# Patient Record
Sex: Female | Born: 1990 | Race: White | Hispanic: No | Marital: Married | Smoking: Never smoker
Health system: Southern US, Community
[De-identification: ages and names within clinical notes are randomized; demographics above are authoritative.]

## PROBLEM LIST (undated history)

## (undated) ENCOUNTER — Inpatient Hospital Stay (HOSPITAL_COMMUNITY): Payer: Self-pay

## (undated) DIAGNOSIS — E282 Polycystic ovarian syndrome: Secondary | ICD-10-CM

## (undated) DIAGNOSIS — G40909 Epilepsy, unspecified, not intractable, without status epilepticus: Secondary | ICD-10-CM

## (undated) DIAGNOSIS — D649 Anemia, unspecified: Secondary | ICD-10-CM

## (undated) DIAGNOSIS — I10 Essential (primary) hypertension: Secondary | ICD-10-CM

## (undated) HISTORY — DX: Anemia, unspecified: D64.9

## (undated) HISTORY — DX: Polycystic ovarian syndrome: E28.2

---
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
## 2011-02-03 LAB — URINE MICROSCOPIC-ADD ON

## 2011-02-03 LAB — URINALYSIS, ROUTINE W REFLEX MICROSCOPIC
Bilirubin Urine: NEGATIVE
Glucose, UA: NEGATIVE
Ketones, ur: NEGATIVE
Nitrite: POSITIVE — AB
Specific Gravity, Urine: 1.02
Urobilinogen, UA: 0.2
pH: 6

## 2011-08-22 ENCOUNTER — Encounter (HOSPITAL_COMMUNITY): Payer: Self-pay | Admitting: *Deleted

## 2011-08-22 DIAGNOSIS — S0990XA Unspecified injury of head, initial encounter: Secondary | ICD-10-CM | POA: Insufficient documentation

## 2011-08-22 DIAGNOSIS — I10 Essential (primary) hypertension: Secondary | ICD-10-CM | POA: Insufficient documentation

## 2011-08-22 DIAGNOSIS — IMO0002 Reserved for concepts with insufficient information to code with codable children: Secondary | ICD-10-CM | POA: Insufficient documentation

## 2011-08-22 NOTE — ED Notes (Addendum)
Head struck by metal part of pressure washer hose.  Headache, nausea. No LOC

## 2011-08-23 ENCOUNTER — Emergency Department (HOSPITAL_COMMUNITY)
Admission: EM | Admit: 2011-08-23 | Discharge: 2011-08-23 | Disposition: A | Discharge status: Home / Self Care | Payer: BC Managed Care – PPO | Attending: Emergency Medicine | Admitting: Emergency Medicine

## 2011-08-23 DIAGNOSIS — S0990XA Unspecified injury of head, initial encounter: Secondary | ICD-10-CM

## 2011-08-23 HISTORY — DX: Essential (primary) hypertension: I10

## 2011-08-23 NOTE — Discharge Instructions (Signed)
Head Injury, Adult  You have had a head injury that does not appear serious at this time. A concussion is a state of changed mental ability, usually from a blow to the head. You should take clear liquids for the rest of the day and then resume your regular diet. You should not take sedatives or alcoholic beverages for as long as directed by your caregiver after discharge. After injuries such as yours, most problems occur within the first 24 hours.  SYMPTOMS  These minor symptoms may be experienced after discharge:  · Memory difficulties.  · Dizziness.  · Headaches.  · Double vision.  · Hearing difficulties.  · Depression.  · Tiredness.  · Weakness.  · Difficulty with concentration.  If you experience any of these problems, you should not be alarmed. A concussion requires a few days for recovery. Many patients with head injuries frequently experience such symptoms. Usually, these problems disappear without medical care. If symptoms last for more than one day, notify your caregiver. See your caregiver sooner if symptoms are becoming worse rather than better.  HOME CARE INSTRUCTIONS   · During the next 24 hours you must stay with someone who can watch you for the warning signs listed below.  Although it is unlikely that serious side effects will occur, you should be aware of signs and symptoms which may necessitate your return to this location. Side effects may occur up to 7 - 10 days following the injury. It is important for you to carefully monitor your condition and contact your caregiver or seek immediate medical attention if there is a change in your condition.  SEEK IMMEDIATE MEDICAL CARE IF:   · There is confusion or drowsiness.  · You can not awaken the injured person.  · There is nausea (feeling sick to your stomach) or continued, forceful vomiting.  · You notice dizziness or unsteadiness which is getting worse, or inability to walk.  · You have convulsions or unconsciousness.  · You experience severe,  persistent headaches not relieved by over-the-counter or prescription medicines for pain. (Do not take aspirin as this impairs clotting abilities). Take other pain medications only as directed.  · You can not use arms or legs normally.  · There is clear or bloody discharge from the nose or ears.  MAKE SURE YOU:   · Understand these instructions.  · Will watch your condition.  · Will get help right away if you are not doing well or get worse.  Document Released: 04/25/2005 Document Revised: 04/14/2011 Document Reviewed: 03/13/2009  Exit[employer]® Patient Information ©2012 [employer].

## 2011-08-23 NOTE — ED Provider Notes (Signed)
History     CSN: [id number]  Arrival date & time 08/22/11  2209   First MD Initiated Contact with Patient 08/23/11 0016      Chief Complaint  Patient presents with  . Head Injury    (Consider location/radiation/quality/duration/timing/severity/associated sxs/prior treatment) Patient is a 21 y.o. female presenting with head injury. The history is provided by the patient.  Head Injury    patient reports she was struck in the head this evening by the tip of a pressure washer.  She was not struck by the spray itself.  She had no loss of consciousness.  This happened approximately 9 hours ago.  She presents the ER now because of nausea and worsening headache.  She's had no vomiting.  She denies weakness of her upper or lower extremities.  She has no neck pain.  She has no changes in vision.  She took ibuprofen earlier and it has not helped her pain.  She drove herself to the emergency Department be evaluated.  She is not on anticoagulants.  Nothing worsens her symptoms.  Nothing improves her symptoms.  Her symptoms are mild to moderate in severity and constant in nature  Past Medical History  Diagnosis Date  . Hypertension     History reviewed. No pertinent past surgical history.  History reviewed. No pertinent family history.  History  Substance Use Topics  . Smoking status: Never Smoker   . Smokeless tobacco: Not on file  . Alcohol Use: No    OB History    Grav Para Term Preterm Abortions TAB SAB Ect Mult Living                  Review of Systems  All other systems reviewed and are negative.    Allergies  Review of patient's allergies indicates no known allergies.  Home Medications  No current outpatient prescriptions on file.  BP 135/90  Pulse 108  Temp(Src) 100 F (37.8 C) (Oral)  Resp 24  Ht 5\' 6"  (1.676 m)  Wt 210 lb (95.255 kg)  BMI 33.89 kg/m2  SpO2 100%  LMP 08/01/2011  Physical Exam  Nursing note and vitals reviewed. Constitutional: She is  oriented to person, place, and time. She appears well-developed and well-nourished. No distress.  HENT:  Head: Normocephalic and atraumatic.  Eyes: EOM are normal.  Neck: Normal range of motion.  Cardiovascular: Normal rate, regular rhythm and normal heart sounds.   Pulmonary/Chest: Effort normal and breath sounds normal.  Abdominal: Soft. She exhibits no distension. There is no tenderness.  Musculoskeletal: Normal range of motion.  Neurological: She is alert and oriented to person, place, and time.  Skin: Skin is warm and dry.  Psychiatric: She has a normal mood and affect. Judgment normal.    ED Course  Procedures (including critical care time)  Labs Reviewed - No data to display No results found.   1. Minor head injury       MDM  Minor closed head injury. Normal neuro exam. No indication for imaging at this time. Doubt intracranial bleed. Doubt skull fracture. Pt given information on head trauma including strict instructions to return for nausea/vomiting, confusion, altered level of consciousness or new weakness. Pt understand and are agreeable to the plan. Pt drove herself to the ER         Lyanne Co, MD 08/23/11 [phone]

## 2012-02-01 ENCOUNTER — Emergency Department (HOSPITAL_COMMUNITY)
Admission: EM | Admit: 2012-02-01 | Discharge: 2012-02-01 | Disposition: A | Discharge status: Home / Self Care | Payer: BC Managed Care – PPO | Attending: Emergency Medicine | Admitting: Emergency Medicine

## 2012-02-01 ENCOUNTER — Emergency Department (HOSPITAL_COMMUNITY): Payer: BC Managed Care – PPO

## 2012-02-01 ENCOUNTER — Encounter (HOSPITAL_COMMUNITY): Payer: Self-pay | Admitting: *Deleted

## 2012-02-01 DIAGNOSIS — J40 Bronchitis, not specified as acute or chronic: Secondary | ICD-10-CM | POA: Insufficient documentation

## 2012-02-01 DIAGNOSIS — J029 Acute pharyngitis, unspecified: Secondary | ICD-10-CM | POA: Insufficient documentation

## 2012-02-01 MED ORDER — GUAIFENESIN-CODEINE 100-10 MG/5ML PO SYRP: ORAL_SOLUTION | ORAL | Status: DC

## 2012-02-01 NOTE — ED Provider Notes (Signed)
History     CSN: [id number]  Arrival date & time 02/01/12  1056   First MD Initiated Contact with Patient 02/01/12 1137      Chief Complaint  Patient presents with  . Cough  . Nasal Congestion  . Sore Throat    (Consider location/radiation/quality/duration/timing/severity/associated sxs/prior treatment) HPI Comments: Taking dayquil and nightquil with no improvement.  Patient is a 21 y.o. female presenting with cough and pharyngitis. The history is provided by the patient. No language interpreter was used.  Cough This is a new problem. The current episode started 2 days ago. The problem occurs every few minutes. The problem has not changed since onset.The cough is productive of sputum. The maximum temperature recorded prior to her arrival was 102 to 102.9 F. Associated symptoms include sore throat and myalgias. Pertinent negatives include no ear pain, no shortness of breath and no wheezing. The treatment provided mild relief. She is not a smoker. Her past medical history does not include bronchitis, pneumonia, bronchiectasis, COPD, emphysema or asthma.  Sore Throat Associated symptoms include congestion, coughing, a fever, myalgias and a sore throat.    Past Medical History  Diagnosis Date  . Hypertension     History reviewed. No pertinent past surgical history.  No family history on file.  History  Substance Use Topics  . Smoking status: Never Smoker   . Smokeless tobacco: Not on file  . Alcohol Use: No    OB History    Grav Para Term Preterm Abortions TAB SAB Ect Mult Living                  Review of Systems  Constitutional: Positive for fever.  HENT: Positive for congestion and sore throat. Negative for ear pain.   Respiratory: Positive for cough. Negative for shortness of breath and wheezing.   Musculoskeletal: Positive for myalgias.  All other systems reviewed and are negative.    Allergies  Review of patient's allergies indicates no known  allergies.  Home Medications   Current Outpatient Rx  Name Route Sig Dispense Refill  . IBUPROFEN 200 MG PO TABS Oral Take 600 mg by mouth every 6 (six) hours as needed. Pain    . NYQUIL PO Oral Take 2 tablets by mouth every 4 (four) hours as needed. Cold Symptoms    . DAYQUIL PO Oral Take 2 tablets by mouth every 4 (four) hours as needed. Cold Symptoms    . GUAIFENESIN-CODEINE 100-10 MG/5ML PO SYRP  10 ml q 4-6 hrs prn cough 240 mL 0    BP 132/62  Pulse 79  Temp 97.9 F (36.6 C) (Oral)  Resp 18  Ht 5\' 7"  (1.702 m)  Wt 200 lb (90.719 kg)  BMI 31.32 kg/m2  SpO2 99%  LMP 01/25/2012  Physical Exam  Nursing note and vitals reviewed. Constitutional: She is oriented to person, place, and time. She appears well-developed and well-nourished. No distress.  HENT:  Head: Normocephalic and atraumatic.  Mouth/Throat: Uvula is midline, oropharynx is clear and moist and mucous membranes are normal. No uvula swelling. No oropharyngeal exudate, posterior oropharyngeal edema, posterior oropharyngeal erythema or tonsillar abscesses.  Eyes: EOM are normal.  Neck: Normal range of motion.  Cardiovascular: Normal rate, regular rhythm and normal heart sounds.   Pulmonary/Chest: Effort normal and breath sounds normal. No accessory muscle usage. Not tachypneic. No respiratory distress. She has no decreased breath sounds. She has no wheezes. She has no rhonchi. She has no rales. She exhibits no tenderness.  Abdominal:  Soft. She exhibits no distension. There is no tenderness.  Musculoskeletal: Normal range of motion.  Neurological: She is alert and oriented to person, place, and time.  Skin: Skin is warm and dry.  Psychiatric: She has a normal mood and affect. Judgment normal.    ED Course  Procedures (including critical care time)  Labs Reviewed - No data to display Dg Chest 2 View  02/01/2012  *RADIOLOGY REPORT*  Clinical Data: Cough, fever  CHEST - 2 VIEW  Comparison: Report 01/21/2003, no images  available  Findings: Cardiomediastinal silhouette is unremarkable.  No acute infiltrate or pleural effusion.  No pulmonary edema.  Bony thorax is unremarkable.  IMPRESSION: No active disease.   Original Report Authenticated By: Natasha Mead, M.D.      1. Bronchitis   2. Pharyngitis       MDM  rx-robitussin AC, 240 ml       Cough   Evalina Field, [place] 02/01/12 1656

## 2012-02-01 NOTE — ED Notes (Signed)
C/o cough, nasal congestion, sore throat and fever x 2 days; reports cough productive of yellow/white sputum.

## 2012-02-01 NOTE — ED Provider Notes (Signed)
Medical screening examination/treatment/procedure(s) were performed by non-physician practitioner and as supervising physician I was immediately available for consultation/collaboration.   Carleene Cooper III, MD 02/01/12 2018

## 2012-02-01 NOTE — ED Notes (Signed)
Instructions and prescriptions reviewed; f/u information provided. Verbalizes understanding. A&ox4, in no distress.

## 2012-02-01 NOTE — ED Notes (Signed)
Pt presents with cold like symptoms, productive cough, sore throat, headache and pressure, pain in head increases when bending over. Pt states has been sick x 2 days. BBS clear and equal.  Pt states has white/yellow production from both nasal congestion and productive cough. Throat is noted red. NAD noted at this time. Pt states had a 101 fever this morning, denies taking motrin/tylenol.

## 2012-10-14 ENCOUNTER — Emergency Department (HOSPITAL_COMMUNITY)
Admission: EM | Admit: 2012-10-14 | Discharge: 2012-10-14 | Disposition: A | Discharge status: Home / Self Care | Payer: BC Managed Care – PPO | Attending: Emergency Medicine | Admitting: Emergency Medicine

## 2012-10-14 ENCOUNTER — Encounter (HOSPITAL_COMMUNITY): Payer: Self-pay | Admitting: *Deleted

## 2012-10-14 ENCOUNTER — Other Ambulatory Visit: Payer: Self-pay

## 2012-10-14 ENCOUNTER — Emergency Department (HOSPITAL_COMMUNITY): Payer: BC Managed Care – PPO

## 2012-10-14 DIAGNOSIS — R42 Dizziness and giddiness: Secondary | ICD-10-CM | POA: Insufficient documentation

## 2012-10-14 DIAGNOSIS — I10 Essential (primary) hypertension: Secondary | ICD-10-CM | POA: Insufficient documentation

## 2012-10-14 DIAGNOSIS — J029 Acute pharyngitis, unspecified: Secondary | ICD-10-CM | POA: Insufficient documentation

## 2012-10-14 DIAGNOSIS — R0789 Other chest pain: Secondary | ICD-10-CM | POA: Insufficient documentation

## 2012-10-14 LAB — CBC WITH DIFFERENTIAL/PLATELET
Basophils Absolute: 0 10*3/uL (ref 0.0–0.1)
Basophils Relative: 0 % (ref 0–1)
Eosinophils Absolute: 0 10*3/uL (ref 0.0–0.7)
Eosinophils Relative: 0 % (ref 0–5)
HCT: 40.1 % (ref 36.0–46.0)
Hemoglobin: 13.1 g/dL (ref 12.0–15.0)
Lymphocytes Relative: 15 % (ref 12–46)
Lymphs Abs: 1.7 10*3/uL (ref 0.7–4.0)
MCH: 28.9 pg (ref 26.0–34.0)
MCHC: 32.7 g/dL (ref 30.0–36.0)
MCV: 88.5 fL (ref 78.0–100.0)
Monocytes Absolute: 0.9 10*3/uL (ref 0.1–1.0)
Monocytes Relative: 8 % (ref 3–12)
Neutro Abs: 8.9 10*3/uL — ABNORMAL HIGH (ref 1.7–7.7)
Neutrophils Relative %: 77 % (ref 43–77)
Platelets: 275 10*3/uL (ref 150–400)
RBC: 4.53 MIL/uL (ref 3.87–5.11)
RDW: 12.6 % (ref 11.5–15.5)
WBC: 11.6 10*3/uL — ABNORMAL HIGH (ref 4.0–10.5)

## 2012-10-14 LAB — COMPREHENSIVE METABOLIC PANEL
ALT: 14 U/L (ref 0–35)
AST: 14 U/L (ref 0–37)
Albumin: 3.9 g/dL (ref 3.5–5.2)
Alkaline Phosphatase: 63 U/L (ref 39–117)
BUN: 11 mg/dL (ref 6–23)
CO2: 27 mEq/L (ref 19–32)
Calcium: 9.8 mg/dL (ref 8.4–10.5)
Chloride: 99 mEq/L (ref 96–112)
Creatinine, Ser: 0.77 mg/dL (ref 0.50–1.10)
GFR calc Af Amer: >90 mL/min (ref >90)
GFR calc non Af Amer: >90 mL/min (ref >90)
Glucose, Bld: 88 mg/dL (ref 70–99)
Potassium: 3.6 mEq/L (ref 3.5–5.1)
Sodium: 136 mEq/L (ref 135–145)
Total Bilirubin: 0.3 mg/dL (ref 0.3–1.2)
Total Protein: 7.7 g/dL (ref 6.0–8.3)

## 2012-10-14 NOTE — ED Notes (Addendum)
Pt c/o sob that started suddenly a hour ago associated with being "lightheaded", chest heaviness that has been intermittent that started today,chest heaviness is not radiating.  reports nausea two days ago, sore throat yesterday but not today,

## 2012-10-14 NOTE — ED Provider Notes (Signed)
History  This chart was scribed for Benny Lennert, MD by Bennett Scrape, ED Scribe. This patient was seen in room APA02/APA02 and the patient's care was started at 3:49 PM.  CSN: [id number]  Arrival date & time 10/14/12  1400   First MD Initiated Contact with Patient 10/14/12 1549      Chief Complaint  Patient presents with  . Shortness of Breath  . Dizziness     Patient is a 22 y.o. female presenting with shortness of breath. The history is provided by the patient. No language interpreter was used.  Shortness of Breath Severity:  Moderate Onset quality:  Gradual Duration:  4 hours Timing:  Intermittent Progression:  Partially resolved Chronicity:  New Context: not emotional upset and not URI   Relieved by:  Nothing Worsened by:  Nothing tried Ineffective treatments:  None tried Associated symptoms: sore throat   Associated symptoms: no abdominal pain, no chest pain, no cough, no headaches and no rash   Risk factors: no hx of PE/DVT, no recent surgery and no tobacco use     HPI Comments: Mercedes Koch is a 22 y.o. female who presents to the Emergency Department complaining of 2 episodes of gradual onset, constant SOB with associated chest tightness and lightheadedness that last for 30 minutes and started while at work. She states that the symptoms would worsen but then gradually resolve. Last episode occurred 2 hours ago. She states that currently she still has mild chest tightness but denies SOB. She denies having any extra stress at work today stating that she was working [employer]er, [employer] which is a normal station for her. She reports a sore throat but denies urinary symptoms, fever and cough as associated symptoms. She has a h/o HTN but denies smoking and alcohol use.  Past Medical History  Diagnosis Date  . Hypertension     History reviewed. No pertinent past surgical history.  No family history on file.  History  Substance Use Topics  . Smoking status: Never Smoker    . Smokeless tobacco: Not on file  . Alcohol Use: No  works at [employer]  No OB history provided.  Review of Systems  Constitutional: Negative for appetite change and fatigue.  HENT: Positive for sore throat. Negative for congestion, sinus pressure and ear discharge.   Eyes: Negative for discharge.  Respiratory: Positive for chest tightness and shortness of breath. Negative for cough.   Cardiovascular: Negative for chest pain.  Gastrointestinal: Negative for abdominal pain and diarrhea.  Genitourinary: Negative for frequency and hematuria.  Musculoskeletal: Negative for back pain.  Skin: Negative for rash.  Neurological: Positive for light-headedness. Negative for seizures and headaches.  Psychiatric/Behavioral: Negative for hallucinations.    Allergies  Review of patient's allergies indicates no known allergies.  Home Medications   Current Outpatient Rx  Name  Route  Sig  Dispense  Refill  . Pseudoephedrine-APAP-DM (DAYQUIL PO)   Oral   Take 2 tablets by mouth every 4 (four) hours as needed. Cold Symptoms           Triage Vitals: BP 147/89  Pulse 105  Temp(Src) 98 F (36.7 C) (Oral)  Resp 18  Ht 5\' 7"  (1.702 m)  Wt 225 lb (102.059 kg)  BMI 35.23 kg/m2  SpO2 98%  LMP 09/30/2012  Physical Exam  Nursing note and vitals reviewed. Constitutional: She is oriented to person, place, and time. She appears well-developed.  HENT:  Head: Normocephalic.  Eyes: Conjunctivae and EOM are normal. No scleral  icterus.  Neck: Neck supple. No thyromegaly present.  Cardiovascular: Normal rate and regular rhythm.  Exam reveals no gallop and no friction rub.   No murmur heard. Pulmonary/Chest: No stridor. She has no wheezes. She has no rales. She exhibits no tenderness.  Abdominal: She exhibits no distension. There is no tenderness. There is no rebound.  Musculoskeletal: Normal range of motion. She exhibits no edema.  Lymphadenopathy:    She has no cervical adenopathy.   Neurological: She is oriented to person, place, and time. Coordination normal.  Skin: No rash noted. No erythema.  Psychiatric: She has a normal mood and affect. Her behavior is normal.    ED Course  Procedures (including critical care time)  DIAGNOSTIC STUDIES: Oxygen Saturation is 98% on room air, normal by my interpretation.    COORDINATION OF CARE: 4:00 PM-Discussed treatment plan which includes CXR, CBC panel and CMP with pt at bedside and pt agreed to plan.   5:47 PM-Informed pt of radiology and lab work results. Discussed discharge plan with pt and pt agreed to plan. Also advised pt to follow up as needed and pt agreed. Addressed symptoms to return for with pt.   Labs Reviewed  CBC WITH DIFFERENTIAL - Abnormal; Notable for the following:    WBC 11.6 (*)    Neutro Abs 8.9 (*)    All other components within normal limits  COMPREHENSIVE METABOLIC PANEL    Dg Chest 2 View  10/14/2012   *RADIOLOGY REPORT*  Clinical Data: Short of breath, dizzy, weak on chest  CHEST - 2 VIEW  Comparison: Prior chest x-ray 02/01/2012  Findings: The lungs are well-aerated and free from pulmonary edema, focal airspace consolidation or pulmonary nodule.  Cardiac and mediastinal contours are within normal limits.  No pneumothorax, or pleural effusion. No acute osseous findings.  IMPRESSION:  No acute cardiopulmonary disease.   Original Report Authenticated By: Malachy Moan, M.D.     No diagnosis found.   Date: 10/14/2012  Rate: 88  Rhythm: normal sinus rhythm  QRS Axis: normal  Intervals: normal  ST/T Wave abnormalities: normal  Conduction Disutrbances:none  Narrative Interpretation:   Old EKG Reviewed: none available    MDM  Chest wall pain The chart was scribed for me under my direct supervision.  I personally performed the history, physical, and medical decision making and all procedures in the evaluation of this patient.Benny Lennert, MD 10/14/12 [phone]

## 2013-02-28 ENCOUNTER — Emergency Department (HOSPITAL_COMMUNITY): Payer: BC Managed Care – PPO

## 2013-02-28 ENCOUNTER — Encounter (HOSPITAL_COMMUNITY): Payer: Self-pay | Admitting: Emergency Medicine

## 2013-02-28 ENCOUNTER — Emergency Department (HOSPITAL_COMMUNITY)
Admission: EM | Admit: 2013-02-28 | Discharge: 2013-02-28 | Disposition: A | Discharge status: Home / Self Care | Payer: BC Managed Care – PPO | Attending: Emergency Medicine | Admitting: Emergency Medicine

## 2013-02-28 DIAGNOSIS — I10 Essential (primary) hypertension: Secondary | ICD-10-CM | POA: Insufficient documentation

## 2013-02-28 DIAGNOSIS — R109 Unspecified abdominal pain: Secondary | ICD-10-CM

## 2013-02-28 DIAGNOSIS — R110 Nausea: Secondary | ICD-10-CM | POA: Insufficient documentation

## 2013-02-28 DIAGNOSIS — R1013 Epigastric pain: Secondary | ICD-10-CM | POA: Insufficient documentation

## 2013-02-28 DIAGNOSIS — Z3202 Encounter for pregnancy test, result negative: Secondary | ICD-10-CM | POA: Insufficient documentation

## 2013-02-28 DIAGNOSIS — R1012 Left upper quadrant pain: Secondary | ICD-10-CM | POA: Insufficient documentation

## 2013-02-28 DIAGNOSIS — R1011 Right upper quadrant pain: Secondary | ICD-10-CM | POA: Insufficient documentation

## 2013-02-28 LAB — URINALYSIS, ROUTINE W REFLEX MICROSCOPIC
Bilirubin Urine: NEGATIVE
Glucose, UA: NEGATIVE mg/dL
Ketones, ur: NEGATIVE mg/dL
Nitrite: NEGATIVE
Protein, ur: NEGATIVE mg/dL
Specific Gravity, Urine: >1.03 — ABNORMAL HIGH (ref 1.005–1.030)
Urobilinogen, UA: 0.2 mg/dL (ref 0.0–1.0)
pH: 6 (ref 5.0–8.0)

## 2013-02-28 LAB — CBC
HCT: 42.2 % (ref 36.0–46.0)
Hemoglobin: 13.9 g/dL (ref 12.0–15.0)
MCH: 29.3 pg (ref 26.0–34.0)
MCHC: 32.9 g/dL (ref 30.0–36.0)
MCV: 89 fL (ref 78.0–100.0)
Platelets: 294 10*3/uL (ref 150–400)
RBC: 4.74 MIL/uL (ref 3.87–5.11)
RDW: 13 % (ref 11.5–15.5)
WBC: 9.5 10*3/uL (ref 4.0–10.5)

## 2013-02-28 LAB — URINE MICROSCOPIC-ADD ON

## 2013-02-28 LAB — BASIC METABOLIC PANEL
BUN: 14 mg/dL (ref 6–23)
CO2: 28 mEq/L (ref 19–32)
Calcium: 10.1 mg/dL (ref 8.4–10.5)
Chloride: 101 mEq/L (ref 96–112)
Creatinine, Ser: 0.84 mg/dL (ref 0.50–1.10)
GFR calc Af Amer: >90 mL/min (ref >90)
GFR calc non Af Amer: >90 mL/min (ref >90)
Glucose, Bld: 105 mg/dL — ABNORMAL HIGH (ref 70–99)
Potassium: 4 mEq/L (ref 3.5–5.1)
Sodium: 139 mEq/L (ref 135–145)

## 2013-02-28 LAB — PREGNANCY, URINE: Preg Test, Ur: NEGATIVE

## 2013-02-28 MED ORDER — PANTOPRAZOLE SODIUM 40 MG IV SOLR
40.0000 mg | Freq: Once | INTRAVENOUS | Status: AC
  Administered 2013-02-28: 40 mg via INTRAVENOUS
  Filled 2013-02-28: qty 40

## 2013-02-28 MED ORDER — ONDANSETRON HCL 4 MG/2ML IJ SOLN
4.0000 mg | Freq: Once | INTRAMUSCULAR | Status: AC
  Administered 2013-02-28: 4 mg via INTRAVENOUS
  Filled 2013-02-28: qty 2

## 2013-02-28 MED ORDER — CEPHALEXIN 500 MG PO CAPS: 500.0000 mg | ORAL_CAPSULE | Freq: Four times a day (QID) | ORAL | Status: DC

## 2013-02-28 MED ORDER — PANTOPRAZOLE SODIUM 20 MG PO TBEC: 20.0000 mg | DELAYED_RELEASE_TABLET | Freq: Every day | ORAL | Status: DC

## 2013-02-28 MED ORDER — HYDROMORPHONE HCL PF 1 MG/ML IJ SOLN
0.5000 mg | Freq: Once | INTRAMUSCULAR | Status: AC
  Administered 2013-02-28: 0.5 mg via INTRAVENOUS
  Filled 2013-02-28: qty 1

## 2013-02-28 MED ORDER — HYDROCODONE-ACETAMINOPHEN 5-325 MG PO TABS: 1.0000 | ORAL_TABLET | Freq: Four times a day (QID) | ORAL | Status: DC | PRN

## 2013-02-28 NOTE — ED Provider Notes (Signed)
CSN: [id number]     Arrival date & time 02/28/13  1629 History  This chart was scribed for Benny Lennert, MD by Bennett Scrape, ED Scribe. This patient was seen in room APA16A/APA16A and the patient's care was started at 6:33 PM.   Chief Complaint  Patient presents with  . Abdominal Pain    Patient is a 22 y.o. female presenting with abdominal pain. The history is provided by the patient. No language interpreter was used.  Abdominal Pain Pain location:  Epigastric, RUQ and LUQ Pain radiates to:  Back Pain severity:  Moderate Onset quality:  Gradual Timing:  Constant Progression:  Worsening Chronicity:  New Worsened by:  Deep breathing Ineffective treatments:  None tried Associated symptoms: nausea   Associated symptoms: no chest pain, no cough, no diarrhea, no fatigue and no hematuria     HPI Comments: Mercedes Koch is a 22 y.o. female who presents to the Emergency Department complaining of bilateral upper abdominal pain, mostly in the epigastric region, that gradually began radiating to her back. She states that currently the radiation pain is more severe and reports that the pain is worsened with deep breathing. She lists nausea as an associated symptom. She denies any prior episodes of the same. She denies any emesis or recent leg swelling.    Past Medical History  Diagnosis Date  . Hypertension    History reviewed. No pertinent past surgical history. No family history on file. History  Substance Use Topics  . Smoking status: Never Smoker   . Smokeless tobacco: Not on file  . Alcohol Use: No   No OB history provided.  Review of Systems  Constitutional: Negative for appetite change and fatigue.  HENT: Negative for congestion, ear discharge and sinus pressure.   Eyes: Negative for discharge.  Respiratory: Negative for cough.   Cardiovascular: Negative for chest pain and leg swelling.  Gastrointestinal: Positive for nausea and abdominal pain. Negative for  diarrhea.  Genitourinary: Negative for frequency and hematuria.  Musculoskeletal: Negative for back pain.  Skin: Negative for rash.  Neurological: Negative for seizures and headaches.  Psychiatric/Behavioral: Negative for hallucinations.    Allergies  Review of patient's allergies indicates no known allergies.  Home Medications  No current outpatient prescriptions on file.  Triage Vitals: BP 139/90  Pulse 94  Temp(Src) 98.1 F (36.7 C) (Oral)  Resp 18  Ht 5\' 5"  (1.651 m)  Wt 218 lb (98.884 kg)  BMI 36.28 kg/m2  SpO2 100%  Physical Exam  Nursing note and vitals reviewed. Constitutional: She is oriented to person, place, and time. She appears well-developed and well-nourished.  HENT:  Head: Normocephalic and atraumatic.  Eyes: Conjunctivae and EOM are normal. No scleral icterus.  Neck: Neck supple. No thyromegaly present.  Cardiovascular: Normal rate and regular rhythm.  Exam reveals no gallop and no friction rub.   No murmur heard. Pulmonary/Chest: Effort normal and breath sounds normal. No stridor. She has no wheezes. She has no rales. She exhibits no tenderness.  Abdominal: She exhibits no distension. There is tenderness (moderate RUQ tenderness). There is no rebound.  Musculoskeletal: Normal range of motion. She exhibits no edema.  Lymphadenopathy:    She has no cervical adenopathy.  Neurological: She is oriented to person, place, and time. She exhibits normal muscle tone. Coordination normal.  Skin: No rash noted. No erythema.  Psychiatric: She has a normal mood and affect. Her behavior is normal.    ED Course  Procedures (including critical care time)  DIAGNOSTIC STUDIES: Oxygen Saturation is 100% on room air, normal by my interpretation.    COORDINATION OF CARE: 6:36 PM-Discussed treatment plan which includes BMP, CBC and UA with pt at bedside and pt agreed to plan.   8:22 PM-Informed pt of radiology and lab work results that shows UTI. Discussed discharge  plan which includes medications with pt and pt agreed to plan. Also advised pt to follow up as needed and pt agreed. Addressed symptoms to return for with pt.   Labs Review Labs Reviewed  URINALYSIS, ROUTINE W REFLEX MICROSCOPIC - Abnormal; Notable for the following:    APPearance CLOUDY (*)    Specific Gravity, Urine >1.030 (*)    Hgb urine dipstick LARGE (*)    Leukocytes, UA TRACE (*)    All other components within normal limits  BASIC METABOLIC PANEL - Abnormal; Notable for the following:    Glucose, Bld 105 (*)    All other components within normal limits  URINE MICROSCOPIC-ADD ON - Abnormal; Notable for the following:    Squamous Epithelial / LPF MANY (*)    Bacteria, UA MANY (*)    All other components within normal limits  URINE CULTURE  PREGNANCY, URINE  CBC   Imaging Review Dg Abd Acute W/chest  02/28/2013   CLINICAL DATA:  Epigastric pain and nausea  EXAM: ACUTE ABDOMEN SERIES (ABDOMEN 2 VIEW & CHEST 1 VIEW)  COMPARISON:  Chest 10/14/2012  FINDINGS: Chest: The lungs are clear. Negative for infiltrate effusion or heart failure.  Abdomen: Undigested tablets are present in the colon. Negative for bowel obstruction or free air. Negative for ileus.  IMPRESSION: Negative abdominal radiographs.  No acute cardiopulmonary disease.   Electronically Signed   By: Marlan Palau M.D.   On: 02/28/2013 19:42    EKG Interpretation   None       MDM  No diagnosis found.   The chart was scribed for me under my direct supervision.  I personally performed the history, physical, and medical decision making and all procedures in the evaluation of this patient.Benny Lennert, MD 02/28/13 2025

## 2013-02-28 NOTE — ED Notes (Signed)
Epigastric pain radiating into back

## 2013-03-02 LAB — URINE CULTURE: Colony Count: >=100000

## 2013-03-03 ENCOUNTER — Telehealth (HOSPITAL_COMMUNITY): Payer: Self-pay | Admitting: Emergency Medicine

## 2013-03-03 NOTE — ED Notes (Signed)
Post ED Visit - Positive Culture Follow-up  Culture report reviewed by antimicrobial stewardship pharmacist: []  Wes Dulaney, Pharm.D., BCPS []  Celedonio Miyamoto, Pharm.D., BCPS []  Georgina Pillion, Pharm.D., BCPS []  [place], [place].D., BCPS, AAHIVP [x]  [name], Pharm.D., BCPS, AAHIVP  Positive urine culture Treated with Keflex, organism sensitive to the same and no further patient follow-up is required at this time.  Mercedes Koch 03/03/2013, 10:10 AM

## 2013-03-06 ENCOUNTER — Other Ambulatory Visit: Payer: Self-pay | Admitting: *Deleted

## 2013-03-06 ENCOUNTER — Other Ambulatory Visit: Payer: Self-pay | Admitting: Obstetrics and Gynecology

## 2013-03-07 MED ORDER — DROSPIRENONE-ETHINYL ESTRADIOL 3-0.02 MG PO TABS: ORAL_TABLET | ORAL | Status: DC

## 2016-06-18 ENCOUNTER — Encounter (HOSPITAL_COMMUNITY): Payer: Self-pay | Admitting: Emergency Medicine

## 2016-06-18 ENCOUNTER — Emergency Department (HOSPITAL_COMMUNITY)
Admission: EM | Admit: 2016-06-18 | Discharge: 2016-06-18 | Disposition: A | Discharge status: Home / Self Care | Payer: Medicaid Other | Attending: Emergency Medicine | Admitting: Emergency Medicine

## 2016-06-18 DIAGNOSIS — R600 Localized edema: Secondary | ICD-10-CM | POA: Insufficient documentation

## 2016-06-18 DIAGNOSIS — M7989 Other specified soft tissue disorders: Secondary | ICD-10-CM

## 2016-06-18 DIAGNOSIS — Z79899 Other long term (current) drug therapy: Secondary | ICD-10-CM | POA: Diagnosis not present

## 2016-06-18 DIAGNOSIS — I10 Essential (primary) hypertension: Secondary | ICD-10-CM | POA: Diagnosis not present

## 2016-06-18 HISTORY — DX: Epilepsy, unspecified, not intractable, without status epilepticus: G40.909

## 2016-06-18 LAB — URINALYSIS, ROUTINE W REFLEX MICROSCOPIC
Bilirubin Urine: NEGATIVE
Glucose, UA: NEGATIVE mg/dL
Hgb urine dipstick: NEGATIVE
Ketones, ur: 20 mg/dL — AB
Nitrite: NEGATIVE
Protein, ur: NEGATIVE mg/dL
Specific Gravity, Urine: 1.009 (ref 1.005–1.030)
pH: 7 (ref 5.0–8.0)

## 2016-06-18 MED ORDER — ENOXAPARIN SODIUM 120 MG/0.8ML ~~LOC~~ SOLN
1.0000 mg/kg | Freq: Once | SUBCUTANEOUS | Status: AC
  Administered 2016-06-18: 120 mg via SUBCUTANEOUS
  Filled 2016-06-18: qty 0.8

## 2016-06-18 MED ORDER — CEPHALEXIN 500 MG PO CAPS: 500.0000 mg | ORAL_CAPSULE | Freq: Four times a day (QID) | ORAL | 0 refills | Status: DC

## 2016-06-18 NOTE — Discharge Instructions (Signed)
Follow up tomorrow for us of left leg

## 2016-06-18 NOTE — ED Notes (Signed)
Call to lab re: urine culture 

## 2016-06-18 NOTE — ED Notes (Signed)
Noticed leg swelling 2 days ago- Dr Francee PiccoloMcCloud is physician G1, P0  FHR 154 by oppler with Barbara CowerJason, RN

## 2016-06-18 NOTE — ED Notes (Signed)
Pt reluctant to leave until her FHT are reassessed-

## 2016-06-18 NOTE — ED Triage Notes (Signed)
Patient c/o leg cramps with swelling in left leg x1 week that is progressively getting worse. Patient is [redacted] weeks pregnant, first pregnancy, [facility] [place].  Denies any problems with blood pressure. No complications with pregnancy at this time.

## 2016-06-18 NOTE — ED Notes (Signed)
Dr Zammit in to assess 

## 2016-06-18 NOTE — ED Provider Notes (Signed)
[facility] DEPT Provider Note   CSN: [id number] Arrival date & time: 06/18/16  1039     History   Chief Complaint Chief Complaint  Patient presents with  . Leg Swelling    HPI Mercedes Koch is a 26 y.o. female.  Pt complains of swelling to left leg for one week.  She is [redacted] weeks pregnant    Leg Pain   This is a new problem. The current episode started more than 1 week ago. The problem occurs constantly. The problem has not changed since onset.The pain is present in the left lower leg. The quality of the pain is described as dull. The pain is at a severity of 5/10. The pain is moderate. Pertinent negatives include no numbness. She has tried nothing for the symptoms. The treatment provided no relief. There has been no history of extremity trauma.    Past Medical History:  Diagnosis Date  . Epilepsy (HCC)   . Hypertension     There are no active problems to display for this patient.   History reviewed. No pertinent surgical history.  OB History    Gravida Para Term Preterm AB Living   1             SAB TAB Ectopic Multiple Live Births                   Home Medications    Prior to Admission medications   Medication Sig Start Date End Date Taking? Authorizing Provider  drospirenone-ethinyl estradiol (GIANVI) 3-0.02 MG tablet TAKE ONE TABLET BY MOUTH EVERY DAY. Patient not taking: Reported on 06/18/2016 03/07/13   Tilda Burrow, MD    Family History History reviewed. No pertinent family history.  Social History Social History  Substance Use Topics  . Smoking status: Never Smoker  . Smokeless tobacco: Never Used  . Alcohol use No     Allergies   Patient has no known allergies.   Review of Systems Review of Systems  Constitutional: Negative for appetite change and fatigue.  HENT: Negative for congestion, ear discharge and sinus pressure.   Eyes: Negative for discharge.  Respiratory: Negative for cough.   Cardiovascular: Negative for chest  pain.  Gastrointestinal: Negative for abdominal pain and diarrhea.  Genitourinary: Negative for frequency and hematuria.  Musculoskeletal: Negative for back pain.       Swelling pain left leg  Skin: Negative for rash.  Neurological: Negative for seizures, numbness and headaches.  Psychiatric/Behavioral: Negative for hallucinations.     Physical Exam Updated Vital Signs BP 129/91 (BP Location: Left Arm)   Pulse 93   Temp 98.1 F (36.7 C) (Oral)   Resp 16   Ht 5\' 5"  (1.651 m)   Wt 260 lb (117.9 kg)   LMP 12/22/2015   SpO2 98%   BMI 43.27 kg/m   Physical Exam  Constitutional: She is oriented to person, place, and time. She appears well-developed.  HENT:  Head: Normocephalic.  Eyes: Conjunctivae are normal.  Neck: No tracheal deviation present.  Cardiovascular:  No murmur heard. Musculoskeletal: Normal range of motion.  Swelling left leg with tenderness  Neurological: She is oriented to person, place, and time.  Skin: Skin is warm.  Psychiatric: She has a normal mood and affect.     ED Treatments / Results  Labs (all labs ordered are listed, but only abnormal results are displayed) Labs Reviewed - No data to display  EKG  EKG Interpretation None  Radiology No results found.  Procedures Procedures (including critical care time)  Medications Ordered in ED Medications - No data to display   Initial Impression / Assessment and Plan / ED Course  I have reviewed the triage vital signs and the nursing notes.  Pertinent labs & imaging results that were available during my care of the patient were reviewed by me and considered in my medical decision making (see chart for details).     Pt with possible dvt,  Pt to get lovenox and will get us of leg in am.  Ob-gyn consulted by phone  Final Clinical Impressions(s) / ED Diagnoses   Final diagnoses:  None    New Prescriptions New Prescriptions   No medications on file     Bethann BerkshireJoseph Tamisha Nordstrom,  MD 06/18/16 1507

## 2016-06-19 ENCOUNTER — Other Ambulatory Visit (HOSPITAL_COMMUNITY): Payer: Medicaid Other

## 2016-06-20 ENCOUNTER — Ambulatory Visit (HOSPITAL_COMMUNITY)
Admission: RE | Admit: 2016-06-20 | Discharge: 2016-06-20 | Disposition: A | Discharge status: Home / Self Care | Payer: Medicaid Other | Source: Ambulatory Visit | Attending: Emergency Medicine | Admitting: Emergency Medicine

## 2016-06-20 DIAGNOSIS — M7989 Other specified soft tissue disorders: Secondary | ICD-10-CM | POA: Insufficient documentation

## 2016-06-20 LAB — URINE CULTURE: Culture: <10000 — AB

## 2018-03-29 DIAGNOSIS — Z01419 Encounter for gynecological examination (general) (routine) without abnormal findings: Secondary | ICD-10-CM | POA: Diagnosis not present

## 2018-03-29 DIAGNOSIS — Z6841 Body Mass Index (BMI) 40.0 and over, adult: Secondary | ICD-10-CM | POA: Diagnosis not present

## 2018-03-29 DIAGNOSIS — N914 Secondary oligomenorrhea: Secondary | ICD-10-CM | POA: Diagnosis not present

## 2018-03-29 LAB — HM PAP SMEAR: HM Pap smear: NEGATIVE

## 2018-11-15 DIAGNOSIS — N912 Amenorrhea, unspecified: Secondary | ICD-10-CM | POA: Diagnosis not present

## 2018-12-18 DIAGNOSIS — N914 Secondary oligomenorrhea: Secondary | ICD-10-CM | POA: Diagnosis not present

## 2019-06-10 ENCOUNTER — Ambulatory Visit: Payer: BC Managed Care – PPO | Admitting: Family Medicine

## 2019-06-10 ENCOUNTER — Other Ambulatory Visit: Payer: Self-pay

## 2019-06-10 ENCOUNTER — Encounter: Payer: Self-pay | Admitting: Family Medicine

## 2019-06-10 VITALS — BP 126/78 | HR 88 | Temp 97.3°F | Resp 14 | Ht 65.0 in | Wt 298.2 lb

## 2019-06-10 DIAGNOSIS — E282 Polycystic ovarian syndrome: Secondary | ICD-10-CM

## 2019-06-10 DIAGNOSIS — R519 Headache, unspecified: Secondary | ICD-10-CM | POA: Diagnosis not present

## 2019-06-10 DIAGNOSIS — R5383 Other fatigue: Secondary | ICD-10-CM

## 2019-06-10 DIAGNOSIS — Z862 Personal history of diseases of the blood and blood-forming organs and certain disorders involving the immune mechanism: Secondary | ICD-10-CM

## 2019-06-10 DIAGNOSIS — E559 Vitamin D deficiency, unspecified: Secondary | ICD-10-CM

## 2019-06-10 DIAGNOSIS — Z7689 Persons encountering health services in other specified circumstances: Secondary | ICD-10-CM

## 2019-06-10 DIAGNOSIS — N914 Secondary oligomenorrhea: Secondary | ICD-10-CM

## 2019-06-10 DIAGNOSIS — H539 Unspecified visual disturbance: Secondary | ICD-10-CM

## 2019-06-10 DIAGNOSIS — Z6841 Body Mass Index (BMI) 40.0 and over, adult: Secondary | ICD-10-CM

## 2019-06-10 DIAGNOSIS — E039 Hypothyroidism, unspecified: Secondary | ICD-10-CM

## 2019-06-10 DIAGNOSIS — R635 Abnormal weight gain: Secondary | ICD-10-CM | POA: Diagnosis not present

## 2019-06-10 DIAGNOSIS — R7989 Other specified abnormal findings of blood chemistry: Secondary | ICD-10-CM

## 2019-06-10 DIAGNOSIS — E66813 Obesity, class 3: Secondary | ICD-10-CM

## 2019-06-10 DIAGNOSIS — I10 Essential (primary) hypertension: Secondary | ICD-10-CM | POA: Insufficient documentation

## 2019-06-10 DIAGNOSIS — E8881 Metabolic syndrome: Secondary | ICD-10-CM

## 2019-06-10 DIAGNOSIS — Z87898 Personal history of other specified conditions: Secondary | ICD-10-CM

## 2019-06-10 HISTORY — DX: Personal history of diseases of the blood and blood-forming organs and certain disorders involving the immune mechanism: Z86.2

## 2019-06-10 NOTE — Progress Notes (Signed)
Name: Mercedes Koch   MRN: [id number]    DOB: [birth year]   Date:06/10/2019       Progress Note  Chief Complaint  Patient presents with  . New Patient (Initial Visit)  . Migraine    with aura vision with spots, dizziness, mostly on 1 side of head behind eye, pt states cut out caffenine and has not had one this past month. Lost of energy and fatigue     Subjective:   Mercedes Koch is a 29 y.o. female, presents to clinic for routine follow up on the conditions listed above.  Pt new to est care [id number], hx of PCOS, last year was working towards trying to conceive again but has since stopped efforts with OB due to headaches.  She has not had a primary care provider for many years and had difficulty finding someone  Over the past year she had episodes of HA's, new, through most of 2020, started out maybe once a month and gradually worsened throughout the year. HA's became more frequent and severe, occurring almost every other day.  No hx of migraines before,  Prior to onset of HA's she has bilateral visual disturbance described as  "spots so intense she can't see" causing blurred vision/obscurred vision (no tunnel vision or curtain like vision loss or changes), then usually pain occurs as vision gets a little better, pain is usually located to left eye/temple behind eye, throbbing pain, associated with photophobia and nausea.  HA's usually last for 2-3 hours, tried excederine, tylenol, ibuprofen, laying down in a dark room, heat to head.  Usually lying down helps to decrease pain, usually there very dull for the remainer of the day and she will feel "out of it"  She did not have a PCP and could not get in to be evaluated when sx were severe Over the last month (Jan 2021) she cut out all caffeine and frequency and intensity decreased only 1 episode in the past month.  Caffeine was minimal to start with one cup of coffee a day and maybe a soda. No personal hx of HA, no family of migraines No  known family hx of aneurysm Last year she did take Clomid, progesterone and letrozole from her OB/GYN.  She did stop because of the headaches.  She has previously taken OCPs and other birth control and not had any oral hormonal medication because headaches.  She did some fertility treatment in the past when conceiving her daughter and did not have any side effects or headaches then either.  She has hx of seizure disorder - epilepsy, cannot remember last neuro visit.  She states that she believes she had seizure disorder with absence seizures and sometimes her whole body shaking when she had a fever.  She does not recall who she went to for neurology.  She does state that she had seizures through high school and even shortly after high school but does not recall ever going to neurology during those years.  Does not remember being on any medication for them either.  Hx of PCOS per Karna Christmas - on care everywhere only as of 2019 -no recent labs available in care everywhere  She had her daughter via C-section June 2018   Patient is concerned about weight gain and severe fatigue  She states she gained about 50 lbs during pregnancy 2017 -2018, lost all weight after delivery while nursing and was back to her baseline weight 160-180.  after stopping breast feeding gained back  a lot of weight even having weight higher than her top weight in pregnancy.  She states she is hungry all the time, she is sleeping mostly pretty good with her daughter, she does work 40 hours, has family members and her husband helping care for her daughter.  She tries to exercise but not as much as she would like to especially over the last year.  She endorses severe fatigued.  Also states her hair has thinned and skin has become dry over the past couple years. She endorses good moods does not feel depressed or down, she has no lack of interest in things do not feel that she is a failure or disappointment, did not have any postpartum  depression. She has never been to a dietitian or to any weight management specialist. No family history of thyroid disease or MDD  Mom with hx of MI very young she has had 2 heart attacks possibly when she was [age] or 39   Patient Active Problem List   Diagnosis Date Noted  . History of anemia 06/10/2019  . Hypertension 06/10/2019  . Metabolic syndrome 06/10/2019  . PCOS (polycystic ovarian syndrome) 06/10/2019  . Secondary oligomenorrhea 06/10/2019  . Class 3 severe obesity with body mass index (BMI) of 45.0 to 49.9 in adult [facility](HCC) 06/10/2019    Past Surgical History:  Procedure Laterality Date  . CESAREAN SECTION  2018    Family History  Problem Relation Age of Onset  . Hypertension Mother   . Hyperlipidemia Mother   . Osteoporosis Mother   . Fibromyalgia Mother   . Breast cancer Mother   . Heart attack Mother [id number]  . Hypertension Father   . Hyperlipidemia Father   . Breast cancer Maternal Grandmother   . Heart failure Maternal Grandmother   . Hyperlipidemia Maternal Grandmother   . Hypertension Maternal Grandmother   . Pneumonia Maternal Grandfather   . Dementia Maternal Grandfather   . Alzheimer's disease Maternal Grandfather   . Hypertension Maternal Grandfather   . Breast cancer Paternal Grandmother   . Stroke Paternal Grandmother   . Kidney failure Paternal Grandfather   . Prostate cancer Paternal Grandfather     Social History   Socioeconomic History  . Marital status: Married    Spouse name: jose  . Number of children: 1  . Years of education: 212  . Highest education level: Some college, no degree  Occupational History  . Occupation: [employer]  Tobacco Use  . Smoking status: Never Smoker  . Smokeless tobacco: Never Used  Substance and Sexual Activity  . Alcohol use: No  . Drug use: No  . Sexual activity: Yes  Other Topics Concern  . Not on file  Social History Narrative  . Not on file   Social Determinants of Health   Financial Resource  Strain:   . Difficulty of Paying Living Expenses: Not on file  Food Insecurity:   . Worried About Programme researcher, broadcasting/film/videounning Out of Food in the Last Year: Not on file  . Ran Out of Food in the Last Year: Not on file  Transportation Needs:   . Lack of Transportation (Medical): Not on file  . Lack of Transportation (Non-Medical): Not on file  Physical Activity:   . Days of Exercise per Week: Not on file  . Minutes of Exercise per Session: Not on file  Stress:   . Feeling of Stress : Not on file  Social Connections:   . Frequency of Communication with Friends and Family: Not  on file  . Frequency of Social Gatherings with Friends and Family: Not on file  . Attends Religious Services: Not on file  . Active Member of Clubs or Organizations: Not on file  . Attends Banker Meetings: Not on file  . Marital Status: Not on file  Intimate Partner Violence:   . Fear of Current or Ex-Partner: Not on file  . Emotionally Abused: Not on file  . Physically Abused: Not on file  . Sexually Abused: Not on file    No current outpatient medications on file.  No Known Allergies  Chart Review Today: I personally reviewed active problem list, medication list, allergies, family history, social history, health maintenance, notes from last encounter, lab results, imaging with the patient/caregiver today.  Review of Systems  Constitutional: Negative.   HENT: Negative.   Eyes: Negative.   Respiratory: Negative.   Cardiovascular: Negative.   Gastrointestinal: Negative.   Endocrine: Negative.   Genitourinary: Negative.   Musculoskeletal: Negative.   Skin: Negative.   Allergic/Immunologic: Negative.   Neurological: Negative.   Hematological: Negative.   Psychiatric/Behavioral: Negative.   All other systems reviewed and are negative.       Objective:    Vitals:   06/10/19 0914  BP: 126/78  Pulse: 88  Resp: 14  Temp: (!) 97.3 F (36.3 C)  SpO2: 97%  Weight: 298 lb 3.2 oz (135.3 kg)  Height:  5\' 5"  (1.651 m)    Body mass index is 49.62 kg/m.  Physical Exam Vitals and nursing note reviewed.  Constitutional:      General: She is not in acute distress.    Appearance: Normal appearance. She is well-developed. She is obese. She is not ill-appearing, toxic-appearing or diaphoretic.     Interventions: Face mask in place.     Comments: Well appearing, morbidly obese  HENT:     Head: Normocephalic and atraumatic.     Right Ear: External ear normal.     Left Ear: External ear normal.  Eyes:     General: Lids are normal. No scleral icterus.       Right eye: No discharge.        Left eye: No discharge.     Conjunctiva/sclera: Conjunctivae normal.  Neck:     Trachea: Phonation normal. No tracheal deviation.  Cardiovascular:     Rate and Rhythm: Normal rate and regular rhythm.     Pulses: Normal pulses.          Radial pulses are 2+ on the right side and 2+ on the left side.       Posterior tibial pulses are 2+ on the right side and 2+ on the left side.     Heart sounds: Normal heart sounds. No murmur. No friction rub. No gallop.   Pulmonary:     Effort: Pulmonary effort is normal. No respiratory distress.     Breath sounds: Normal breath sounds. No stridor. No wheezing, rhonchi or rales.  Chest:     Chest wall: No tenderness.  Abdominal:     General: Bowel sounds are normal. There is no distension.     Palpations: Abdomen is soft.     Tenderness: There is no abdominal tenderness. There is no guarding or rebound.  Musculoskeletal:        General: Normal range of motion.     Cervical back: Normal range of motion and neck supple.     Right lower leg: No edema.     Left lower leg:  No edema.  Lymphadenopathy:     Cervical: No cervical adenopathy.  Skin:    General: Skin is warm and dry.     Capillary Refill: Capillary refill takes less than 2 seconds.     Coloration: Skin is not jaundiced or pale.     Findings: No rash.  Neurological:     Mental Status: She is alert and  oriented to person, place, and time.     Motor: No abnormal muscle tone.     Gait: Gait normal.     Comments:  LANG/SPEECH: Naming and repetition intact, fluent, no dysarthria,  answers questions appropriately  CRANIAL NERVES:   II: Pupils equal and reactive, no RAPD   III, IV, VI: EOM intact, no gaze preference or deviation, no nystagmus.   V: normal sensation in V1, V2, and V3 segments bilaterally   VII: no asymmetry, no nasolabial fold flattening   VIII: normal hearing to speech   IX, X: normal palatal elevation, no uvular deviation   XI: 5/5 head turn and 5/5 shoulder shrug bilaterally   XII: midline tongue protrusion  MOTOR:  5/5 bilateral grip strength 5/5 strength dorsiflexion/plantarflexion b/l  SENSORY:  Normal to light touch Romberg absent  COORD: Normal finger to nose  STATION: normal stance, no truncal ataxia  GAIT: Normal   Psychiatric:        Speech: Speech normal.        Behavior: Behavior normal.       PHQ2/9: Depression screen PHQ 2/9 06/10/2019  Decreased Interest 0  Down, Depressed, Hopeless 0  PHQ - 2 Score 0  Altered sleeping 0  Tired, decreased energy 3  Change in appetite 2  Feeling bad or failure about yourself  0  Trouble concentrating 0  Moving slowly or fidgety/restless 0  Suicidal thoughts 0  PHQ-9 Score 5  Difficult doing work/chores Not difficult at all    phq 9 is positive, for appetite and energy - addressed in HPI and A&P, she denies depressed mood/depression  Fall Risk: Fall Risk  06/10/2019  Falls in the past year? 0  Number falls in past yr: 0  Injury with Fall? 0    Functional Status Survey: Is the patient deaf or have difficulty hearing?: No Does the patient have difficulty seeing, even when wearing glasses/contacts?: Yes(with headache episode) Does the patient have difficulty concentrating, remembering, or making decisions?: No Does the patient have difficulty walking or climbing stairs?: No Does the patient have  difficulty dressing or bathing?: No Does the patient have difficulty doing errands alone such as visiting a doctor's office or shopping?: No   Assessment & Plan:   1. Nonintractable episodic headache, unspecified headache type Severe headaches gradually worsening last year some history is very typical of migraines but no history for her, they did improve when she cut off caffeine this last month with other history we will send her to neurology for evaluation.  Normal neuro exam here today  2. Weight gain May be multifactorial including being a new mom, work, Covid pandemic with decreased ability to exercise, she has increased hunger, will check and rule out hypothyroid.   Considering referral to medical weight management, did discuss with patient today - COMPLETE METABOLIC PANEL WITH GFR - Hemoglobin A1c - Lipid panel - TSH  3. Fatigue, unspecified type Rule out anemia, hypothyroid, diabetes - CBC with Differential/Platelet - Hemoglobin A1c - TSH  4. Class 3 severe obesity with body mass index (BMI) of 45.0 to 49.9 in adult, unspecified  obesity type, unspecified whether serious comorbidity present ([place]) See above - COMPLETE METABOLIC PANEL WITH GFR - Hemoglobin A1c - Lipid panel - TSH  5. PCOS (polycystic ovarian syndrome) Infertility, some darkening of facial hair, obesity, unable to see all of OB/GYN's labs - Hemoglobin A1c - Lipid panel  6. Secondary oligomenorrhea See above per OB/GYN  7. History of seizures Very vague history given does seem rather strange that she continued to have seizures through high school and young adulthood and cannot recall being on any medications or seeing neurology will refer to neurology with her recent severe headaches  8. Encounter to establish care with new doctor Reviewed records through care everywhere, only few past ER visits available in our EMR  9. Metabolic syndrome Do suspect metabolic syndrome with increased waist diameter,  screening for other signs of metabolic syndrome -likely insulin resistance - COMPLETE METABOLIC PANEL WITH GFR - Hemoglobin A1c - Lipid panel - TSH   10. Visual disturbance Patient has spotted vision prodromal to her onset of headaches  11. History of anemia Recheck - CBC with Differential/Platelet  12. Hypertension, unspecified type Blood pressure at goal today, hx of HTN prior to and with pregnancy - no need to start meds today - COMPLETE METABOLIC PANEL WITH GFR  13. Vitamin D deficiency - VITAMIN D 25 Hydroxy (Vit-D Deficiency, Fractures)   Greater than 50% of this visit was spent in direct face-to-face counseling, obtaining new patient hx, current compliant history and physical, discussing and educating pt on treatment plan.  Total time of this visit was >65 min today.  Time spent with pt in exam room was from 9:37 am until 10:12 and pt appt today in clinic was from 9:20a and did not check out until 10:31.  Remainder of time today  involved but was not limited to reviewing chart (recent and pertinent OV notes and labs), documentation in EMR, and coordinating care and treatment plan.     Return in about 3 months (around 09/07/2019) for Annual Physical.   Delsa Grana, PA-C 06/10/19 9:37 AM

## 2019-06-11 MED ORDER — LEVOTHYROXINE SODIUM 100 MCG PO TABS: 100.0000 ug | ORAL_TABLET | Freq: Every day | ORAL | 3 refills | Status: DC

## 2019-06-11 MED ORDER — LEVOTHYROXINE SODIUM 25 MCG PO TABS
ORAL_TABLET | ORAL | 0 refills | Status: DC
Start: 2019-06-11 — End: 2019-08-10

## 2019-06-11 MED ORDER — VITAMIN D (ERGOCALCIFEROL) 1.25 MG (50000 UNIT) PO CAPS: 50000.0000 [IU] | ORAL_CAPSULE | ORAL | 0 refills | Status: DC

## 2019-06-11 NOTE — Addendum Note (Signed)
Addended by: Danelle Berry on: 06/11/2019 07:22 PM   Modules accepted: Orders

## 2019-06-12 ENCOUNTER — Telehealth: Payer: Self-pay

## 2019-06-12 DIAGNOSIS — R7989 Other specified abnormal findings of blood chemistry: Secondary | ICD-10-CM

## 2019-06-12 NOTE — Telephone Encounter (Signed)
-----   Message from Danelle Berry, [place] sent at 06/11/2019  7:22 PM EST ----- Please call patient with lab results Her thyroid labs are abnormal and I am going to add on additional labs She may have hypothyroid and may be the cause of her fatigue -we can start replacing with levothyroxine and gradually increase the dose.  Usually requires a taper up to a weight-based dose and repeated labs to check the thyroid levels in about 6 weeks from now  She did have low HDL protective cholesterol, high triglycerides, high LDL cholesterol, prediabetes and low vitamin D  She has no anemia no white count, her kidney function and electrolytes were normal and she had mildly elevated liver enzyme which we will monitor  I would like to get her started on vitamin D supplement -prescription I will send in Thyroid medication Helping with hypothyroid may help her energy and weight improve she should also do what she can do work on healthy diet and exercise I would avoid sweets simple sugars, transfat, saturated fat and focus on complex carbs, whole grains, lean meats and vegetables in her diet  Please have her schedule OV if she wants to review all of this in person  Does need 6 week f/up appt in clinic  Please ask Verlon Au to add on free t4 to blood work for elevated TSH

## 2019-06-13 LAB — LIPID PANEL
Cholesterol: 178 mg/dL (ref <200)
HDL: 36 mg/dL — ABNORMAL LOW (ref >=50)
LDL Cholesterol (Calc): 109 mg/dL (calc) — ABNORMAL HIGH
Non-HDL Cholesterol (Calc): 142 mg/dL (calc) — ABNORMAL HIGH (ref <130)
Total CHOL/HDL Ratio: 4.9 (calc) (ref <5.0)
Triglycerides: 211 mg/dL — ABNORMAL HIGH (ref <150)

## 2019-06-13 LAB — COMPLETE METABOLIC PANEL WITH GFR
AG Ratio: 1.5 (calc) (ref 1.0–2.5)
ALT: 40 U/L — ABNORMAL HIGH (ref 6–29)
AST: 22 U/L (ref 10–30)
Albumin: 4.4 g/dL (ref 3.6–5.1)
Alkaline phosphatase (APISO): 68 U/L (ref 31–125)
BUN: 11 mg/dL (ref 7–25)
CO2: 28 mmol/L (ref 20–32)
Calcium: 9.5 mg/dL (ref 8.6–10.2)
Chloride: 102 mmol/L (ref 98–110)
Creat: 0.61 mg/dL (ref 0.50–1.10)
GFR, Est African American: 142 mL/min/{1.73_m2} (ref >=60)
GFR, Est Non African American: 123 mL/min/{1.73_m2} (ref >=60)
Globulin: 3 g/dL (calc) (ref 1.9–3.7)
Glucose, Bld: 94 mg/dL (ref 65–99)
Potassium: 4.8 mmol/L (ref 3.5–5.3)
Sodium: 139 mmol/L (ref 135–146)
Total Bilirubin: 0.3 mg/dL (ref 0.2–1.2)
Total Protein: 7.4 g/dL (ref 6.1–8.1)

## 2019-06-13 LAB — CBC WITH DIFFERENTIAL/PLATELET
Absolute Monocytes: 675 cells/uL (ref 200–950)
Basophils Absolute: 50 cells/uL (ref 0–200)
Basophils Relative: 0.7 %
Eosinophils Absolute: 163 cells/uL (ref 15–500)
Eosinophils Relative: 2.3 %
HCT: 43.5 % (ref 35.0–45.0)
Hemoglobin: 14.3 g/dL (ref 11.7–15.5)
Lymphs Abs: 1697 cells/uL (ref 850–3900)
MCH: 28.8 pg (ref 27.0–33.0)
MCHC: 32.9 g/dL (ref 32.0–36.0)
MCV: 87.5 fL (ref 80.0–100.0)
MPV: 10.6 fL (ref 7.5–12.5)
Monocytes Relative: 9.5 %
Neutro Abs: 4516 cells/uL (ref 1500–7800)
Neutrophils Relative %: 63.6 %
Platelets: 288 10*3/uL (ref 140–400)
RBC: 4.97 10*6/uL (ref 3.80–5.10)
RDW: 13 % (ref 11.0–15.0)
Total Lymphocyte: 23.9 %
WBC: 7.1 10*3/uL (ref 3.8–10.8)

## 2019-06-13 LAB — HEMOGLOBIN A1C
Hgb A1c MFr Bld: 5.8 % of total Hgb — ABNORMAL HIGH (ref <5.7)
Mean Plasma Glucose: 120 (calc)
eAG (mmol/L): 6.6 (calc)

## 2019-06-13 LAB — VITAMIN D 25 HYDROXY (VIT D DEFICIENCY, FRACTURES): Vit D, 25-Hydroxy: 10 ng/mL — ABNORMAL LOW (ref 30–100)

## 2019-06-13 LAB — TSH: TSH: 8.04 mIU/L — ABNORMAL HIGH

## 2019-06-13 LAB — TEST AUTHORIZATION

## 2019-06-13 LAB — T4, FREE: Free T4: 1 ng/dL (ref 0.8–1.8)

## 2019-06-26 DIAGNOSIS — G43109 Migraine with aura, not intractable, without status migrainosus: Secondary | ICD-10-CM | POA: Diagnosis not present

## 2019-06-26 DIAGNOSIS — R110 Nausea: Secondary | ICD-10-CM | POA: Diagnosis not present

## 2019-06-26 DIAGNOSIS — R569 Unspecified convulsions: Secondary | ICD-10-CM | POA: Diagnosis not present

## 2019-07-27 DIAGNOSIS — R569 Unspecified convulsions: Secondary | ICD-10-CM | POA: Diagnosis not present

## 2019-08-01 ENCOUNTER — Encounter: Payer: Self-pay | Admitting: Family Medicine

## 2019-08-05 ENCOUNTER — Ambulatory Visit: Payer: Self-pay | Admitting: Family Medicine

## 2019-08-10 ENCOUNTER — Emergency Department (HOSPITAL_COMMUNITY): Payer: BC Managed Care – PPO

## 2019-08-10 ENCOUNTER — Inpatient Hospital Stay (HOSPITAL_COMMUNITY)
Admission: EM | Admit: 2019-08-10 | Discharge: 2019-08-12 | DRG: 389 | Disposition: A | Discharge status: Home / Self Care | Payer: BC Managed Care – PPO | Attending: Family Medicine | Admitting: Family Medicine

## 2019-08-10 ENCOUNTER — Inpatient Hospital Stay (HOSPITAL_COMMUNITY): Payer: BC Managed Care – PPO

## 2019-08-10 ENCOUNTER — Other Ambulatory Visit: Payer: Self-pay

## 2019-08-10 ENCOUNTER — Encounter (HOSPITAL_COMMUNITY): Payer: Self-pay

## 2019-08-10 DIAGNOSIS — Z4682 Encounter for fitting and adjustment of non-vascular catheter: Secondary | ICD-10-CM | POA: Diagnosis not present

## 2019-08-10 DIAGNOSIS — Z4659 Encounter for fitting and adjustment of other gastrointestinal appliance and device: Secondary | ICD-10-CM

## 2019-08-10 DIAGNOSIS — E8881 Metabolic syndrome: Secondary | ICD-10-CM | POA: Diagnosis present

## 2019-08-10 DIAGNOSIS — K760 Fatty (change of) liver, not elsewhere classified: Secondary | ICD-10-CM | POA: Diagnosis not present

## 2019-08-10 DIAGNOSIS — Z803 Family history of malignant neoplasm of breast: Secondary | ICD-10-CM

## 2019-08-10 DIAGNOSIS — Z9889 Other specified postprocedural states: Secondary | ICD-10-CM

## 2019-08-10 DIAGNOSIS — K56609 Unspecified intestinal obstruction, unspecified as to partial versus complete obstruction: Secondary | ICD-10-CM | POA: Diagnosis not present

## 2019-08-10 DIAGNOSIS — G40909 Epilepsy, unspecified, not intractable, without status epilepticus: Secondary | ICD-10-CM

## 2019-08-10 DIAGNOSIS — N3000 Acute cystitis without hematuria: Secondary | ICD-10-CM | POA: Diagnosis not present

## 2019-08-10 DIAGNOSIS — N3090 Cystitis, unspecified without hematuria: Secondary | ICD-10-CM | POA: Diagnosis present

## 2019-08-10 DIAGNOSIS — Z6841 Body Mass Index (BMI) 40.0 and over, adult: Secondary | ICD-10-CM

## 2019-08-10 DIAGNOSIS — R1084 Generalized abdominal pain: Secondary | ICD-10-CM | POA: Diagnosis present

## 2019-08-10 DIAGNOSIS — Z823 Family history of stroke: Secondary | ICD-10-CM

## 2019-08-10 DIAGNOSIS — Z79890 Hormone replacement therapy (postmenopausal): Secondary | ICD-10-CM

## 2019-08-10 DIAGNOSIS — N390 Urinary tract infection, site not specified: Secondary | ICD-10-CM | POA: Diagnosis present

## 2019-08-10 DIAGNOSIS — Z20822 Contact with and (suspected) exposure to covid-19: Secondary | ICD-10-CM | POA: Diagnosis present

## 2019-08-10 DIAGNOSIS — E039 Hypothyroidism, unspecified: Secondary | ICD-10-CM | POA: Diagnosis present

## 2019-08-10 DIAGNOSIS — B9620 Unspecified Escherichia coli [E. coli] as the cause of diseases classified elsewhere: Secondary | ICD-10-CM | POA: Diagnosis present

## 2019-08-10 DIAGNOSIS — I10 Essential (primary) hypertension: Secondary | ICD-10-CM | POA: Diagnosis present

## 2019-08-10 DIAGNOSIS — K565 Intestinal adhesions [bands], unspecified as to partial versus complete obstruction: Principal | ICD-10-CM | POA: Diagnosis present

## 2019-08-10 DIAGNOSIS — Z83438 Family history of other disorder of lipoprotein metabolism and other lipidemia: Secondary | ICD-10-CM

## 2019-08-10 DIAGNOSIS — K6389 Other specified diseases of intestine: Secondary | ICD-10-CM | POA: Diagnosis not present

## 2019-08-10 DIAGNOSIS — Z8249 Family history of ischemic heart disease and other diseases of the circulatory system: Secondary | ICD-10-CM

## 2019-08-10 DIAGNOSIS — Z8262 Family history of osteoporosis: Secondary | ICD-10-CM

## 2019-08-10 DIAGNOSIS — Z03818 Encounter for observation for suspected exposure to other biological agents ruled out: Secondary | ICD-10-CM | POA: Diagnosis not present

## 2019-08-10 HISTORY — DX: Unspecified intestinal obstruction, unspecified as to partial versus complete obstruction: K56.609

## 2019-08-10 LAB — URINALYSIS, ROUTINE W REFLEX MICROSCOPIC
Bilirubin Urine: NEGATIVE
Glucose, UA: NEGATIVE mg/dL
Hgb urine dipstick: NEGATIVE
Ketones, ur: NEGATIVE mg/dL
Nitrite: POSITIVE — AB
Protein, ur: 100 mg/dL — AB
Specific Gravity, Urine: 1.021 (ref 1.005–1.030)
pH: 6 (ref 5.0–8.0)

## 2019-08-10 LAB — CBC WITH DIFFERENTIAL/PLATELET
Abs Immature Granulocytes: 0.04 10*3/uL (ref 0.00–0.07)
Basophils Absolute: 0 10*3/uL (ref 0.0–0.1)
Basophils Relative: 0 %
Eosinophils Absolute: 0.1 10*3/uL (ref 0.0–0.5)
Eosinophils Relative: 1 %
HCT: 48.6 % — ABNORMAL HIGH (ref 36.0–46.0)
Hemoglobin: 15.3 g/dL — ABNORMAL HIGH (ref 12.0–15.0)
Immature Granulocytes: 0 %
Lymphocytes Relative: 12 %
Lymphs Abs: 1.3 10*3/uL (ref 0.7–4.0)
MCH: 28.7 pg (ref 26.0–34.0)
MCHC: 31.5 g/dL (ref 30.0–36.0)
MCV: 91.2 fL (ref 80.0–100.0)
Monocytes Absolute: 0.9 10*3/uL (ref 0.1–1.0)
Monocytes Relative: 8 %
Neutro Abs: 8.7 10*3/uL — ABNORMAL HIGH (ref 1.7–7.7)
Neutrophils Relative %: 79 %
Platelets: 295 10*3/uL (ref 150–400)
RBC: 5.33 MIL/uL — ABNORMAL HIGH (ref 3.87–5.11)
RDW: 12.9 % (ref 11.5–15.5)
WBC: 11.1 10*3/uL — ABNORMAL HIGH (ref 4.0–10.5)
nRBC: 0 % (ref 0.0–0.2)

## 2019-08-10 LAB — COMPREHENSIVE METABOLIC PANEL
ALT: 43 U/L (ref 0–44)
AST: 27 U/L (ref 15–41)
Albumin: 4.5 g/dL (ref 3.5–5.0)
Alkaline Phosphatase: 70 U/L (ref 38–126)
Anion gap: 11 (ref 5–15)
BUN: 15 mg/dL (ref 6–20)
CO2: 31 mmol/L (ref 22–32)
Calcium: 9.9 mg/dL (ref 8.9–10.3)
Chloride: 97 mmol/L — ABNORMAL LOW (ref 98–111)
Creatinine, Ser: 0.63 mg/dL (ref 0.44–1.00)
GFR calc Af Amer: >60 mL/min (ref >60)
GFR calc non Af Amer: >60 mL/min (ref >60)
Glucose, Bld: 112 mg/dL — ABNORMAL HIGH (ref 70–99)
Potassium: 3.8 mmol/L (ref 3.5–5.1)
Sodium: 139 mmol/L (ref 135–145)
Total Bilirubin: 0.7 mg/dL (ref 0.3–1.2)
Total Protein: 8 g/dL (ref 6.5–8.1)

## 2019-08-10 LAB — RAPID URINE DRUG SCREEN, HOSP PERFORMED
Amphetamines: NOT DETECTED
Barbiturates: NOT DETECTED
Benzodiazepines: NOT DETECTED
Cocaine: NOT DETECTED
Opiates: NOT DETECTED
Tetrahydrocannabinol: NOT DETECTED

## 2019-08-10 LAB — PREGNANCY, URINE: Preg Test, Ur: NEGATIVE

## 2019-08-10 LAB — LIPASE, BLOOD: Lipase: 19 U/L (ref 11–51)

## 2019-08-10 MED ORDER — ENOXAPARIN SODIUM 80 MG/0.8ML ~~LOC~~ SOLN
70.0000 mg | SUBCUTANEOUS | Status: DC
  Administered 2019-08-10 – 2019-08-11 (×2): 70 mg via SUBCUTANEOUS
  Filled 2019-08-10 (×2): qty 0.8

## 2019-08-10 MED ORDER — LEVOTHYROXINE SODIUM 100 MCG/5ML IV SOLN
50.0000 ug | Freq: Every day | INTRAVENOUS | Status: DC
  Administered 2019-08-10 – 2019-08-11 (×2): 50 ug via INTRAVENOUS
  Filled 2019-08-10 (×2): qty 5

## 2019-08-10 MED ORDER — IOHEXOL 300 MG/ML  SOLN
100.0000 mL | Freq: Once | INTRAMUSCULAR | Status: AC | PRN
  Administered 2019-08-10: 100 mL via INTRAVENOUS

## 2019-08-10 MED ORDER — MORPHINE SULFATE (PF) 2 MG/ML IV SOLN: 1.0000 mg | INTRAVENOUS | Status: DC | PRN

## 2019-08-10 MED ORDER — ACETAMINOPHEN 325 MG PO TABS
650.0000 mg | ORAL_TABLET | Freq: Four times a day (QID) | ORAL | Status: DC | PRN
  Administered 2019-08-11: 650 mg via ORAL
  Filled 2019-08-10: qty 2

## 2019-08-10 MED ORDER — FENTANYL CITRATE (PF) 100 MCG/2ML IJ SOLN
50.0000 ug | Freq: Once | INTRAMUSCULAR | Status: AC
  Administered 2019-08-10: 50 ug via INTRAVENOUS
  Filled 2019-08-10: qty 2

## 2019-08-10 MED ORDER — PHENOL 1.4 % MT LIQD
1.0000 | OROMUCOSAL | Status: DC | PRN
  Administered 2019-08-10: 1 via OROMUCOSAL
  Filled 2019-08-10: qty 177

## 2019-08-10 MED ORDER — SODIUM CHLORIDE 0.9 % IV SOLN
1.0000 g | Freq: Once | INTRAVENOUS | Status: AC
  Administered 2019-08-10: 1 g via INTRAVENOUS
  Filled 2019-08-10: qty 10

## 2019-08-10 MED ORDER — ACETAMINOPHEN 650 MG RE SUPP: 650.0000 mg | Freq: Four times a day (QID) | RECTAL | Status: DC | PRN

## 2019-08-10 MED ORDER — SODIUM CHLORIDE 0.9 % IV SOLN
1.0000 g | INTRAVENOUS | Status: AC
  Administered 2019-08-11 – 2019-08-12 (×2): 1 g via INTRAVENOUS
  Filled 2019-08-10 (×2): qty 10

## 2019-08-10 MED ORDER — PANTOPRAZOLE SODIUM 40 MG IV SOLR
40.0000 mg | INTRAVENOUS | Status: AC
  Administered 2019-08-10 – 2019-08-11 (×2): 40 mg via INTRAVENOUS
  Filled 2019-08-10 (×2): qty 40

## 2019-08-10 MED ORDER — KETOROLAC TROMETHAMINE 30 MG/ML IJ SOLN
30.0000 mg | Freq: Four times a day (QID) | INTRAMUSCULAR | Status: AC | PRN
  Administered 2019-08-10 – 2019-08-11 (×5): 30 mg via INTRAVENOUS
  Filled 2019-08-10 (×6): qty 1

## 2019-08-10 MED ORDER — ONDANSETRON HCL 4 MG/2ML IJ SOLN: 4.0000 mg | Freq: Four times a day (QID) | INTRAMUSCULAR | Status: DC | PRN

## 2019-08-10 MED ORDER — ONDANSETRON HCL 4 MG PO TABS: 4.0000 mg | ORAL_TABLET | Freq: Four times a day (QID) | ORAL | Status: DC | PRN

## 2019-08-10 MED ORDER — MORPHINE SULFATE (PF) 4 MG/ML IV SOLN
4.0000 mg | Freq: Once | INTRAVENOUS | Status: AC
  Administered 2019-08-10: 4 mg via INTRAVENOUS
  Filled 2019-08-10: qty 1

## 2019-08-10 MED ORDER — POTASSIUM CHLORIDE IN NACL 20-0.9 MEQ/L-% IV SOLN: INTRAVENOUS | Status: DC

## 2019-08-10 NOTE — H&P (Addendum)
History and Physical  [employer] GHW:[id number] DOB: 07-26-1990 DOA: 08/10/2019  PCP: Delsa Grana, PA-C  Patient coming from: Home   I have personally briefly reviewed patient's old medical records in [place]  Chief Complaint: abdominal pain   HPI: Mercedes Koch is a 29 y.o. female with medical history significant for PCOS, hypothyroidism, epilepsy and hypertension and having history of cesarean section in 2018 presented to the ED from home.  Patient reports she woke up from sleep this morning with severe epigastric abdominal pain and one loose bowel movement associated with nausea but no emesis.  She having severe mid abdominal pain very tender to palpation describing the pain as a sharp shooting pain that is intermittent associated with a constant aching crampy pain.  Reports that she has gallbladder and appendix.  Patient denies fever and chills.  Patient denies sick contacts.  No prior history of bowel obstruction.  ED Course: Afebrile vital signs stable on arrival, acute abdominal series with findings of distended bowel loops with concerns for bowel obstruction.  Urinalysis positive for infection.  CT abdomen confirms bowel obstruction with transition point.  No abscess no free air noted.  Normal gallbladder and appendix.  Patient was ordered for NG tube placement and started on IV antibiotics and admission was requested.  Review of Systems: As per HPI otherwise 10 point review of systems negative.    Past Medical History:  Diagnosis Date  . Anemia   . Epilepsy ([place])   . Hypertension   . PCOS (polycystic ovarian syndrome)     Past Surgical History:  Procedure Laterality Date  . CESAREAN SECTION  2018     reports that she has never smoked. She has never used smokeless tobacco. She reports that she does not drink alcohol or use drugs.  No Known Allergies  Family History  Problem Relation Age of Onset  . Hypertension Mother   .  Hyperlipidemia Mother   . Osteoporosis Mother   . Fibromyalgia Mother   . Breast cancer Mother   . Heart attack Mother 59  . Hypertension Father   . Hyperlipidemia Father   . Breast cancer Maternal Grandmother   . Heart failure Maternal Grandmother   . Hyperlipidemia Maternal Grandmother   . Hypertension Maternal Grandmother   . Pneumonia Maternal Grandfather   . Dementia Maternal Grandfather   . Alzheimer's disease Maternal Grandfather   . Hypertension Maternal Grandfather   . Breast cancer Paternal Grandmother   . Stroke Paternal Grandmother   . Kidney failure Paternal Grandfather   . Prostate cancer Paternal Grandfather    Prior to Admission medications   Medication Sig Start Date End Date Taking? Authorizing Provider  levothyroxine (SYNTHROID) 100 MCG tablet Take 1 tablet (100 mcg total) by mouth daily. 07/02/19  Yes Delsa Grana, PA-C  Vitamin D, Ergocalciferol, (DRISDOL) 1.25 MG (50000 UNIT) CAPS capsule Take 1 capsule (50,000 Units total) by mouth every 7 (seven) days. x12 weeks. 06/11/19   Delsa Grana, PA-C    Physical Exam: Vitals:   08/10/19 0630 08/10/19 0636 08/10/19 0638 08/10/19 0851  BP: 139/78   100/64  Pulse: 92   81  Resp: 18   17  Temp:  98 F (36.7 C)    TempSrc:  Oral  Oral  SpO2: 97%   97%  Weight:   135.3 kg     Constitutional: NAD, calm, appears comfortable after pain and nausea medication. Eyes: PERRL, lids and conjunctivae normal ENMT: Mucous  membranes are moist. Posterior pharynx clear of any exudate or lesions.Normal dentition.  Neck: normal, supple, no masses, no thyromegaly Respiratory: clear to auscultation bilaterally, no wheezing, no crackles. Normal respiratory effort. No accessory muscle use.  Cardiovascular: Regular rate and rhythm, no murmurs / rubs / gallops. No extremity edema. 2+ pedal pulses. No carotid bruits.  Abdomen: Diffuse epigastric tenderness with light palpation mild guarding, no masses palpated. No hepatosplenomegaly.   Hypoactive bowel sounds positive.  Musculoskeletal: no clubbing / cyanosis. No joint deformity upper and lower extremities. Good ROM, no contractures. Normal muscle tone.  Skin: no rashes, lesions, ulcers. No induration Neurologic: CN 2-12 grossly intact. Sensation intact, DTR normal. Strength 5/5 in all 4.  Psychiatric: Normal judgment and insight. Alert and oriented x 3. Normal mood.   Labs on Admission: I have personally reviewed following labs and imaging studies  CBC: Recent Labs  Lab 08/10/19 0717  WBC 11.1*  NEUTROABS 8.7*  HGB 15.3*  HCT 48.6*  MCV 91.2  PLT 295   Basic Metabolic Panel: Recent Labs  Lab 08/10/19 0717  NA 139  K 3.8  CL 97*  CO2 31  GLUCOSE 112*  BUN 15  CREATININE 0.63  CALCIUM 9.9   GFR: Estimated Creatinine Clearance: 144.6 mL/min (by C-G formula based on SCr of 0.63 mg/dL). Liver Function Tests: Recent Labs  Lab 08/10/19 0717  AST 27  ALT 43  ALKPHOS 70  BILITOT 0.7  PROT 8.0  ALBUMIN 4.5   Recent Labs  Lab 08/10/19 0717  LIPASE 19   No results for input(s): AMMONIA in the last 168 hours. Coagulation Profile: No results for input(s): INR, PROTIME in the last 168 hours. Cardiac Enzymes: No results for input(s): CKTOTAL, CKMB, CKMBINDEX, TROPONINI in the last 168 hours. BNP (last 3 results) No results for input(s): PROBNP in the last 8760 hours. HbA1C: No results for input(s): HGBA1C in the last 72 hours. CBG: No results for input(s): GLUCAP in the last 168 hours. Lipid Profile: No results for input(s): CHOL, HDL, LDLCALC, TRIG, CHOLHDL, LDLDIRECT in the last 72 hours. Thyroid Function Tests: No results for input(s): TSH, T4TOTAL, FREET4, T3FREE, THYROIDAB in the last 72 hours. Anemia Panel: No results for input(s): VITAMINB12, FOLATE, FERRITIN, TIBC, IRON, RETICCTPCT in the last 72 hours. Urine analysis:    Component Value Date/Time   COLORURINE YELLOW 08/10/2019 0723   APPEARANCEUR HAZY (A) 08/10/2019 0723   LABSPEC  1.021 08/10/2019 0723   PHURINE 6.0 08/10/2019 0723   GLUCOSEU NEGATIVE 08/10/2019 0723   HGBUR NEGATIVE 08/10/2019 0723   BILIRUBINUR NEGATIVE 08/10/2019 0723   KETONESUR NEGATIVE 08/10/2019 0723   PROTEINUR 100 (A) 08/10/2019 0723   UROBILINOGEN 0.2 02/28/2013 1725   NITRITE POSITIVE (A) 08/10/2019 0723   LEUKOCYTESUR TRACE (A) 08/10/2019 0723    Radiological Exams on Admission: CT ABDOMEN PELVIS W CONTRAST  Result Date: 08/10/2019 CLINICAL DATA:  Abdominal pain and nausea EXAM: CT ABDOMEN AND PELVIS WITH CONTRAST TECHNIQUE: Multidetector CT imaging of the abdomen and pelvis was performed using the standard protocol following bolus administration of intravenous contrast. CONTRAST:  OMNIPAQUE IOHEXOL 300 MG/ML  SOLN COMPARISON:  Abdominal radiographs August 10, 2019 FINDINGS: Lower chest: Lung bases are clear. Hepatobiliary: There is hepatic steatosis with scattered areas of apparent fatty sparing. There is a 6 mm cyst in the left lobe of the liver anteriorly. Gallbladder wall is not appreciably thickened. There is no biliary duct dilatation. Pancreas: There is no pancreatic mass or inflammatory focus. Spleen: No splenic lesions  are evident. Adrenals/Urinary Tract: Adrenals bilaterally appear normal. There is a cyst in the lower pole of the right kidney measuring 1.5 x 1.5 cm. There is a 4 mm probable cyst in the posterior upper pole left kidney. There is no hydronephrosis on either side. There is no evident renal or ureteral calculus on either side. Urinary bladder is midline with wall thickness upper normal. Stomach/Bowel: There is distention of the stomach with fluid and food material. There is small bowel dilatation to the level of the proximal ileum. A focal transition zone is noted in the proximal ileum, best seen on axial slice 69 series 3, felt to be indicative of a degree of small bowel obstruction. Small bowel does not appear appreciably thickened. No free air or portal venous air is  evident. The terminal ileum appears unremarkable. Vascular/Lymphatic: No abdominal aortic aneurysm. No arterial vascular calcifications are evident. Major venous structures appear patent. There is no evident adenopathy in the abdomen or pelvis. Reproductive: Uterus is anteverted.  No evident pelvic mass. Other: No appendiceal region inflammation evident. No abscess or ascites evident in the abdomen or pelvis. Musculoskeletal: No blastic or lytic bone lesions are evident. No intramuscular or abdominal wall lesions are appreciable. IMPRESSION: 1. Evidence of a degree of small bowel obstruction with transition zone in the upper pelvis essentially in the midline, involving proximal ileum. No free air or portal venous air. 2.  No abscess in the abdomen or pelvis.  Appendix appears normal. 3. Urinary bladder wall thickness appears upper normal. It may be prudent to correlate with urinalysis to assess for possible earliest changes of cystitis. Note that there is no hydronephrosis on either side. No renal or ureteral calculus on either side. 4.  Hepatic steatosis. Electronically Signed   By: Bretta Bang III M.D.   On: 08/10/2019 09:27   DG ABD ACUTE 2+V W 1V CHEST  Result Date: 08/10/2019 CLINICAL DATA:  Abdominal pain EXAM: DG ABDOMEN ACUTE W/ 1V CHEST COMPARISON:  Abdomen series February 28, 2013 FINDINGS: PA chest: Lungs are clear. Heart size and pulmonary vascularity are normal. No adenopathy. Supine and upright abdomen: There are loops of dilated small bowel with multiple air-fluid levels. No free air evident. No abnormal calcifications. IMPRESSION: Multiple loops of dilated small bowel with air-fluid levels. Concern for a degree of bowel obstruction. No free air. Lungs clear. Electronically Signed   By: Bretta Bang III M.D.   On: 08/10/2019 08:10   Assessment/Plan Principal Problem:   SBO (small bowel obstruction) (HCC) Active Problems:   Hypertension   Metabolic syndrome   Class 3 severe obesity  with body mass index (BMI) of 45.0 to 49.9 in adult [facility])   Cystitis   UTI (urinary tract infection)   Epilepsy (HCC)   Generalized abdominal pain   History of abdominal surgery   Hypothyroidism   1. Small bowel obstruction with transition point-likely secondary to abdominal adhesions from cesarean section from 2018.  Admit for observation continue NG tube placement, low intermittent suction, n.p.o., IV pain and nausea medications, IV fluids for hydration.  Continue bowel rest as ordered.  If does not improve after reasonable bowel rest and supportive therapy would consider surgery consultation at that point. 2. Cystitis-acute symptomatic treating with IV ceftriaxone and follow urine culture. 3. Hypothyroidism-holding oral levothyroxine switch to IV while NPO. 4. Generalized abdominal pain-secondary to SBO treating with IV morphine and Toradol as needed. 5. Essential hypertension-blood pressures well controlled at this time consider IV as needed treatment if needed  for elevated BP readings. 6. Leukocytosis - likely from acute cystitis and stress reaction, follow CBC with diff.    DVT prophylaxis: Enoxaparin Code Status: Full Family Communication: Plan of care discussed with patient at bedside who verbalized understanding. Disposition Plan: Hospitalization for IV fluids and supportive care Consults called: N/A Admission status: OBS  Khamil Lamica MD [facility] How to contact the [facility] Attending or Consulting provider 7A - 7P or covering provider during after hours 7P -7A, for this patient?  1. Check the care team in [facility] and look for a) attending/consulting [facility] provider listed and b) the [facility] team listed 2. Log into [email or web address] and use [place]'s universal password to access. If you do not have the password, please contact the hospital operator. 3. Locate the [facility] provider you are looking for under [facility] and page to a number that you can be directly reached. 4. If  you still have difficulty reaching the provider, please page the [facility] (Director on Call) for the Hospitalists listed on amion for assistance.  If 7PM-7AM, please contact night-coverage [email or web address] Password [email or web address]  08/10/2019, 10:14 AM

## 2019-08-10 NOTE — ED Triage Notes (Signed)
Pt reports abdominal pain that woke her from sleep this morning. Pt has had one loose stool and nausea. Pt reports pain to mid abdomen, tender with palpation, describes pain as sharp shooting intermittently, and constant aching pain.

## 2019-08-10 NOTE — ED Notes (Signed)
Report to Reuel Boom, RN on 300

## 2019-08-10 NOTE — ED Provider Notes (Signed)
[facility] EMERGENCY DEPARTMENT Provider Note   CSN: [id number] Arrival date & time: 08/10/19  1950     History Chief Complaint  Patient presents with  . Abdominal Pain    Mercedes Koch is a 29 y.o. female.  HPI   This patient is a 29 year old female, she presents with a complaint of abdominal pain which is located in the upper abdomen, sharp in nature and has no radiation.  This seems to be fluctuating over the last 4 hours since it woke her up from sleep at approximately 3:00 in the morning.  The pain is sharp, severe at times, seems to be getting slightly worse and is associated with nausea though no vomiting.  She did have one episode of loose stools.  She denies any coughing shortness of breath chest pain fevers or chills.  She has no dysuria.  Her only prior abdominal surgery was a cesarean section, she still has a gallbladder and appendix.  She has had no medication for the pain prior to arrival.  She denies having any alcohol, she does not smoke, she reports that she had a salad last night prior to going to bed.  Past Medical History:  Diagnosis Date  . Anemia   . Epilepsy (HCC)   . Hypertension   . PCOS (polycystic ovarian syndrome)     Patient Active Problem List   Diagnosis Date Noted  . History of anemia 06/10/2019  . Hypertension 06/10/2019  . Metabolic syndrome 06/10/2019  . PCOS (polycystic ovarian syndrome) 06/10/2019  . Secondary oligomenorrhea 06/10/2019  . Class 3 severe obesity with body mass index (BMI) of 45.0 to 49.9 in adult [facility]) 06/10/2019    Past Surgical History:  Procedure Laterality Date  . CESAREAN SECTION  2018     OB History    Gravida  1   Para      Term      Preterm      AB      Living        SAB      TAB      Ectopic      Multiple      Live Births              Family History  Problem Relation Age of Onset  . Hypertension Mother   . Hyperlipidemia Mother   . Osteoporosis Mother   . Fibromyalgia Mother     . Breast cancer Mother   . Heart attack Mother 59  . Hypertension Father   . Hyperlipidemia Father   . Breast cancer Maternal Grandmother   . Heart failure Maternal Grandmother   . Hyperlipidemia Maternal Grandmother   . Hypertension Maternal Grandmother   . Pneumonia Maternal Grandfather   . Dementia Maternal Grandfather   . Alzheimer's disease Maternal Grandfather   . Hypertension Maternal Grandfather   . Breast cancer Paternal Grandmother   . Stroke Paternal Grandmother   . Kidney failure Paternal Grandfather   . Prostate cancer Paternal Grandfather     Social History   Tobacco Use  . Smoking status: Never Smoker  . Smokeless tobacco: Never Used  Substance Use Topics  . Alcohol use: No  . Drug use: No    Home Medications Prior to Admission medications   Medication Sig Start Date End Date Taking? Authorizing Provider  levothyroxine (SYNTHROID) 100 MCG tablet Take 1 tablet (100 mcg total) by mouth daily. 07/02/19  Yes Danelle Berry, PA-C  levothyroxine (SYNTHROID) 25 MCG tablet Take  1 tab poqam x 1w, then take 2 tab poqam x 1w, take 3 tab poqam x 1 week, take on empty stomach wait 30 min before eating or other meds Patient not taking: Reported on 08/10/2019 06/11/19   Delsa Grana, PA-C  Vitamin D, Ergocalciferol, (DRISDOL) 1.25 MG (50000 UNIT) CAPS capsule Take 1 capsule (50,000 Units total) by mouth every 7 (seven) days. x12 weeks. 06/11/19   Delsa Grana, PA-C    Allergies    Patient has no known allergies.  Review of Systems   Review of Systems  All other systems reviewed and are negative.   Physical Exam Updated Vital Signs BP 139/78   Pulse 92   Temp 98 F (36.7 C) (Oral)   Resp 18   Wt 135.3 kg   SpO2 97%   BMI 49.64 kg/m   Physical Exam Vitals and nursing note reviewed.  Constitutional:      General: She is not in acute distress.    Appearance: She is well-developed.  HENT:     Head: Normocephalic and atraumatic.     Mouth/Throat:     Pharynx: No  oropharyngeal exudate.  Eyes:     General: No scleral icterus.       Right eye: No discharge.        Left eye: No discharge.     Conjunctiva/sclera: Conjunctivae normal.     Pupils: Pupils are equal, round, and reactive to light.     Comments: No jaundice  Neck:     Thyroid: No thyromegaly.     Vascular: No JVD.  Cardiovascular:     Rate and Rhythm: Normal rate and regular rhythm.     Heart sounds: Normal heart sounds. No murmur. No friction rub. No gallop.   Pulmonary:     Effort: Pulmonary effort is normal. No respiratory distress.     Breath sounds: Normal breath sounds. No wheezing or rales.  Abdominal:     General: Bowel sounds are normal. There is no distension.     Palpations: Abdomen is soft. There is no mass.     Tenderness: There is abdominal tenderness.     Comments: Overweight, very soft abdomen, tenderness in the epigastrium and left upper quadrant, no Murphy sign, no tenderness in the lower abdomen, no guarding or peritoneal signs  Musculoskeletal:        General: No tenderness. Normal range of motion.     Cervical back: Normal range of motion and neck supple.  Lymphadenopathy:     Cervical: No cervical adenopathy.  Skin:    General: Skin is warm and dry.     Findings: No erythema or rash.  Neurological:     General: No focal deficit present.     Mental Status: She is alert.     Coordination: Coordination normal.     Comments: Awake alert and following commands without any difficulty  Psychiatric:        Behavior: Behavior normal.     ED Results / Procedures / Treatments   Labs (all labs ordered are listed, but only abnormal results are displayed) Labs Reviewed  COMPREHENSIVE METABOLIC PANEL - Abnormal; Notable for the following components:      Result Value   Chloride 97 (*)    Glucose, Bld 112 (*)    All other components within normal limits  CBC WITH DIFFERENTIAL/PLATELET - Abnormal; Notable for the following components:   WBC 11.1 (*)    RBC 5.33  (*)    Hemoglobin 15.3 (*)  HCT 48.6 (*)    Neutro Abs 8.7 (*)    All other components within normal limits  URINALYSIS, ROUTINE W REFLEX MICROSCOPIC - Abnormal; Notable for the following components:   APPearance HAZY (*)    Protein, ur 100 (*)    Nitrite POSITIVE (*)    Leukocytes,Ua TRACE (*)    Bacteria, UA MANY (*)    All other components within normal limits  URINE CULTURE  SARS CORONAVIRUS 2 (TAT 6-24 HRS)  LIPASE, BLOOD  PREGNANCY, URINE    EKG None  Radiology CT ABDOMEN PELVIS W CONTRAST  Result Date: 08/10/2019 CLINICAL DATA:  Abdominal pain and nausea EXAM: CT ABDOMEN AND PELVIS WITH CONTRAST TECHNIQUE: Multidetector CT imaging of the abdomen and pelvis was performed using the standard protocol following bolus administration of intravenous contrast. CONTRAST:  100mL OMNIPAQUE IOHEXOL 300 MG/ML  SOLN COMPARISON:  Abdominal radiographs August 10, 2019 FINDINGS: Lower chest: Lung bases are clear. Hepatobiliary: There is hepatic steatosis with scattered areas of apparent fatty sparing. There is a 6 mm cyst in the left lobe of the liver anteriorly. Gallbladder wall is not appreciably thickened. There is no biliary duct dilatation. Pancreas: There is no pancreatic mass or inflammatory focus. Spleen: No splenic lesions are evident. Adrenals/Urinary Tract: Adrenals bilaterally appear normal. There is a cyst in the lower pole of the right kidney measuring 1.5 x 1.5 cm. There is a 4 mm probable cyst in the posterior upper pole left kidney. There is no hydronephrosis on either side. There is no evident renal or ureteral calculus on either side. Urinary bladder is midline with wall thickness upper normal. Stomach/Bowel: There is distention of the stomach with fluid and food material. There is small bowel dilatation to the level of the proximal ileum. A focal transition zone is noted in the proximal ileum, best seen on axial slice 69 series 3, felt to be indicative of a degree of small bowel  obstruction. Small bowel does not appear appreciably thickened. No free air or portal venous air is evident. The terminal ileum appears unremarkable. Vascular/Lymphatic: No abdominal aortic aneurysm. No arterial vascular calcifications are evident. Major venous structures appear patent. There is no evident adenopathy in the abdomen or pelvis. Reproductive: Uterus is anteverted.  No evident pelvic mass. Other: No appendiceal region inflammation evident. No abscess or ascites evident in the abdomen or pelvis. Musculoskeletal: No blastic or lytic bone lesions are evident. No intramuscular or abdominal wall lesions are appreciable. IMPRESSION: 1. Evidence of a degree of small bowel obstruction with transition zone in the upper pelvis essentially in the midline, involving proximal ileum. No free air or portal venous air. 2.  No abscess in the abdomen or pelvis.  Appendix appears normal. 3. Urinary bladder wall thickness appears upper normal. It may be prudent to correlate with urinalysis to assess for possible earliest changes of cystitis. Note that there is no hydronephrosis on either side. No renal or ureteral calculus on either side. 4.  Hepatic steatosis. Electronically Signed   By: Bretta BangWilliam  Woodruff III M.D.   On: 08/10/2019 09:27   DG ABD ACUTE 2+V W 1V CHEST  Result Date: 08/10/2019 CLINICAL DATA:  Abdominal pain EXAM: DG ABDOMEN ACUTE W/ 1V CHEST COMPARISON:  Abdomen series February 28, 2013 FINDINGS: PA chest: Lungs are clear. Heart size and pulmonary vascularity are normal. No adenopathy. Supine and upright abdomen: There are loops of dilated small bowel with multiple air-fluid levels. No free air evident. No abnormal calcifications. IMPRESSION: Multiple loops of dilated  small bowel with air-fluid levels. Concern for a degree of bowel obstruction. No free air. Lungs clear. Electronically Signed   By: Bretta Bang III M.D.   On: 08/10/2019 08:10    Procedures Procedures (including critical care  time)  Medications Ordered in ED Medications  cefTRIAXone (ROCEPHIN) 1 g in sodium chloride 0.9 % 100 mL IVPB (has no administration in time range)  morphine 4 MG/ML injection 4 mg (has no administration in time range)  fentaNYL (SUBLIMAZE) injection 50 mcg (50 mcg Intravenous Given 08/10/19 0847)  iohexol (OMNIPAQUE) 300 MG/ML solution 100 mL (100 mLs Intravenous Contrast Given 08/10/19 0859)    ED Course  I have reviewed the triage vital signs and the nursing notes.  Pertinent labs & imaging results that were available during my care of the patient were reviewed by me and considered in my medical decision making (see chart for details).    MDM Rules/Calculators/A&P                       This patient complains of abdominal pain, this involves an extensive number of treatment options, and is a complaint that carries with it a high risk of complications and morbidity.  The differential diagnosis includes bowel obstruction, pancreatitis, perforation of bowel, volvulus or malrotation, abdominal aortic aneurysm    I Ordered, reviewed, and interpreted labs, which included lipase, metabolic panel, CBC, urinalysis with pregnancy test, this showed that the patient does not fact have what looks like a urinary tract infection with many bacteria and 21-50 white blood cells with positive nitrites.  Antibiotics will be added  I ordered medication fentanyl for pain and Rocephin for antibiotic coverage for urinary tract infection   I ordered imaging studies which included acute abdominal series showing a possible bowel obstruction and a CT scan and  I independently visualized and interpreted imaging which showed dilated loops of small bowel on the acute abdominal series, CT scan pending  Additional history obtained from old records, shows that the patient was last seen in the emergency department for swelling of the leg several years ago during her pregnancy.  She has not been seen in the emergency  department since that time  Previous records obtained and reviewed   I consulted the hospitalist, Dr. Laural Benes and discussed lab and imaging findings, requested admission to the hospital   On repeat exam this patient has ongoing abdominal discomfort which is slightly worse but still has a significant nonperitoneal abdomen.  The urinalysis was treated for a possible urinary tract infection with intravenous antibiotics, an NG tube was placed to decompress the stomach, the patient does have evidence of small bowel obstruction on the CT scan.  Covid testing obtained for asymptomatic admission  Mercedes Koch was evaluated in Emergency Department on 08/10/2019 for the symptoms described in the history of present illness. She was evaluated in the context of the global COVID-19 pandemic, which necessitated consideration that the patient might be at risk for infection with the SARS-CoV-2 virus that causes COVID-19. Institutional protocols and algorithms that pertain to the evaluation of patients at risk for COVID-19 are in a state of rapid change based on information released by regulatory bodies including the CDC and federal and state organizations. These policies and algorithms were followed during the patient's care in the ED.     Final Clinical Impression(s) / ED Diagnoses Final diagnoses:  SBO (small bowel obstruction) (HCC)  Acute cystitis without hematuria    Rx / DC  Orders ED Discharge Orders    None       Eber Hong, MD 08/10/19 [phone]

## 2019-08-11 ENCOUNTER — Inpatient Hospital Stay (HOSPITAL_COMMUNITY): Payer: BC Managed Care – PPO

## 2019-08-11 DIAGNOSIS — E8881 Metabolic syndrome: Secondary | ICD-10-CM

## 2019-08-11 DIAGNOSIS — R109 Unspecified abdominal pain: Secondary | ICD-10-CM | POA: Diagnosis not present

## 2019-08-11 DIAGNOSIS — R197 Diarrhea, unspecified: Secondary | ICD-10-CM | POA: Diagnosis not present

## 2019-08-11 LAB — CBC WITH DIFFERENTIAL/PLATELET
Abs Immature Granulocytes: 0.01 10*3/uL (ref 0.00–0.07)
Basophils Absolute: 0 10*3/uL (ref 0.0–0.1)
Basophils Relative: 1 %
Eosinophils Absolute: 0.2 10*3/uL (ref 0.0–0.5)
Eosinophils Relative: 3 %
HCT: 41.5 % (ref 36.0–46.0)
Hemoglobin: 12.7 g/dL (ref 12.0–15.0)
Immature Granulocytes: 0 %
Lymphocytes Relative: 28 %
Lymphs Abs: 1.7 10*3/uL (ref 0.7–4.0)
MCH: 28.1 pg (ref 26.0–34.0)
MCHC: 30.6 g/dL (ref 30.0–36.0)
MCV: 91.8 fL (ref 80.0–100.0)
Monocytes Absolute: 0.7 10*3/uL (ref 0.1–1.0)
Monocytes Relative: 11 %
Neutro Abs: 3.6 10*3/uL (ref 1.7–7.7)
Neutrophils Relative %: 57 %
Platelets: 257 10*3/uL (ref 150–400)
RBC: 4.52 MIL/uL (ref 3.87–5.11)
RDW: 13.1 % (ref 11.5–15.5)
WBC: 6.2 10*3/uL (ref 4.0–10.5)
nRBC: 0 % (ref 0.0–0.2)

## 2019-08-11 LAB — COMPREHENSIVE METABOLIC PANEL
ALT: 33 U/L (ref 0–44)
AST: 20 U/L (ref 15–41)
Albumin: 3.4 g/dL — ABNORMAL LOW (ref 3.5–5.0)
Alkaline Phosphatase: 55 U/L (ref 38–126)
Anion gap: 9 (ref 5–15)
BUN: 11 mg/dL (ref 6–20)
CO2: 24 mmol/L (ref 22–32)
Calcium: 8.4 mg/dL — ABNORMAL LOW (ref 8.9–10.3)
Chloride: 107 mmol/L (ref 98–111)
Creatinine, Ser: 0.58 mg/dL (ref 0.44–1.00)
GFR calc Af Amer: >60 mL/min (ref >60)
GFR calc non Af Amer: >60 mL/min (ref >60)
Glucose, Bld: 92 mg/dL (ref 70–99)
Potassium: 3.8 mmol/L (ref 3.5–5.1)
Sodium: 140 mmol/L (ref 135–145)
Total Bilirubin: 0.5 mg/dL (ref 0.3–1.2)
Total Protein: 6.2 g/dL — ABNORMAL LOW (ref 6.5–8.1)

## 2019-08-11 LAB — SARS CORONAVIRUS 2 (TAT 6-24 HRS): SARS Coronavirus 2: NEGATIVE

## 2019-08-11 LAB — HIV ANTIBODY (ROUTINE TESTING W REFLEX): HIV Screen 4th Generation wRfx: NONREACTIVE

## 2019-08-11 LAB — MAGNESIUM: Magnesium: 2 mg/dL (ref 1.7–2.4)

## 2019-08-11 MED ORDER — PANTOPRAZOLE SODIUM 40 MG PO TBEC
40.0000 mg | DELAYED_RELEASE_TABLET | Freq: Every day | ORAL | Status: DC
  Administered 2019-08-12: 40 mg via ORAL
  Filled 2019-08-11: qty 1

## 2019-08-11 MED ORDER — LEVOTHYROXINE SODIUM 100 MCG PO TABS
100.0000 ug | ORAL_TABLET | Freq: Every day | ORAL | Status: DC
  Administered 2019-08-12: 100 ug via ORAL
  Filled 2019-08-11: qty 1

## 2019-08-11 NOTE — Progress Notes (Signed)
PROGRESS NOTE [facility]   Mercedes Koch  ZOX:[id number]  DOB: 1990/08/15  DOA: 08/10/2019 PCP: Danelle Berry, PA-C   Brief Admission Hx: 29 y.o. female with medical history significant for PCOS, hypothyroidism, epilepsy and hypertension and having history of cesarean section in 2018 presented to the ED from home.  Patient reports she woke up from sleep this morning with severe epigastric abdominal pain and one loose bowel movement associated with nausea but no emesis.  She having severe mid abdominal pain very tender to palpation describing the pain as a sharp shooting pain that is intermittent associated with a constant aching crampy pain.  Reports that she has gallbladder and appendix.  Patient denies fever and chills.  Patient denies sick contacts.  No prior history of bowel obstruction.  MDM/Assessment & Plan:   1. Small bowel obstruction with transition point-likely secondary to abdominal adhesions from cesarean section from 2018.  s/p NG tube placement, low intermittent suction.   Pt is having flatus now.  Patient reports improvement in abdominal pain.  Repeat abdominal x-rays show marked improvement to resolution of the small bowel obstruction.  Will start by discontinuing NG tube today.  Start clear liquid diet.  Advance to full liquids as tolerated. 2. Cystitis-acute symptomatic treating with IV ceftriaxone and follow urine culture. 3. Hypothyroidism-resume oral levothyroxine dose from home.. 4. Generalized abdominal pain-secondary to SBO, improving with resolution of SBO. 5. Essential hypertension-blood pressures well controlled at this time consider IV as needed treatment if needed for elevated BP readings. 6. Leukocytosis - RESOLVED. WBC normal now.  Was likely elevated from acute cystitis and stress reaction, follow CBC with diff.    DVT prophylaxis: Enoxaparin Code Status: Full Family Communication: Plan of care discussed with patient at bedside who verbalized  understanding. Disposition Plan: Hospitalization for IV fluids and supportive care Consults called: N/A Admission status: OBS  Consultants:  n/a  Procedures:  NG placement/removal  Antimicrobials:  n/a   Subjective: Patient reports that her abdominal pain is improving.  She reports that she is having flatus.  She would like to start a diet.  Objective: Vitals:   08/10/19 1243 08/10/19 1643 08/10/19 2124 08/11/19 0508  BP: 110/71 115/73 106/64 (!) 106/57  Pulse: 85 75 70 62  Resp: 18 18 18 18   Temp: 97.6 F (36.4 C) 98.2 F (36.8 C) (!) 97.5 F (36.4 C) 97.7 F (36.5 C)  TempSrc: Oral Oral Oral Oral  SpO2: 97% 96% 97% 96%  Weight:      Height: 5\' 5"  (1.651 m)       Intake/Output Summary (Last 24 hours) at 08/11/2019 1149 Last data filed at 08/11/2019 0900 Gross per 24 hour  Intake 321.66 ml  Output 500 ml  Net -178.34 ml   Filed Weights   08/10/19 10/11/2019  Weight: 135.3 kg     REVIEW OF SYSTEMS  As per history otherwise all reviewed and reported negative  Exam:  General exam: Awake alert lying in bed no apparent distress. Respiratory system: Clear. No increased work of breathing. Cardiovascular system: S1 & S2 heard. No JVD, murmurs, gallops, clicks or pedal edema. Gastrointestinal system: Abdomen is nondistended, soft and mild right upper and left upper tenderness. Normal bowel sounds heard.  No guarding. Central nervous system: Alert and oriented. No focal neurological deficits. Extremities: no CCE.  Data Reviewed: Basic Metabolic Panel: Recent Labs  Lab 08/10/19 0717 08/11/19 0612  NA 139 140  K 3.8 3.8  CL 97* 107  CO2 31 24  GLUCOSE 112* 92  BUN 15 11  CREATININE 0.63 0.58  CALCIUM 9.9 8.4*  MG  --  2.0   Liver Function Tests: Recent Labs  Lab 08/10/19 0717 08/11/19 0612  AST 27 20  ALT 43 33  ALKPHOS 70 55  BILITOT 0.7 0.5  PROT 8.0 6.2*  ALBUMIN 4.5 3.4*   Recent Labs  Lab 08/10/19 0717  LIPASE 19   No results for  input(s): AMMONIA in the last 168 hours. CBC: Recent Labs  Lab 08/10/19 0717 08/11/19 0612  WBC 11.1* 6.2  NEUTROABS 8.7* 3.6  HGB 15.3* 12.7  HCT 48.6* 41.5  MCV 91.2 91.8  PLT 295 257   Cardiac Enzymes: No results for input(s): CKTOTAL, CKMB, CKMBINDEX, TROPONINI in the last 168 hours. CBG (last 3)  No results for input(s): GLUCAP in the last 72 hours. Recent Results (from the past 240 hour(s))  SARS CORONAVIRUS 2 (TAT 6-24 HRS) Nasopharyngeal Nasopharyngeal Swab     Status: None   Collection Time: 08/10/19  9:48 AM   Specimen: Nasopharyngeal Swab  Result Value Ref Range Status   SARS Coronavirus 2 NEGATIVE NEGATIVE Final    Comment: (NOTE) SARS-CoV-2 target nucleic acids are NOT DETECTED. The SARS-CoV-2 RNA is generally detectable in upper and lower respiratory specimens during the acute phase of infection. Negative results do not preclude SARS-CoV-2 infection, do not rule out co-infections with other pathogens, and should not be used as the sole basis for treatment or other patient management decisions. Negative results must be combined with clinical observations, patient history, and epidemiological information. The expected result is Negative. Fact Sheet for Patients: HairSlick.no Fact Sheet for Healthcare Providers: quierodirigir.com This test is not yet approved or cleared by the [place] FDA and  has been authorized for detection and/or diagnosis of SARS-CoV-2 by FDA under an Emergency Use Authorization (EUA). This EUA will remain  in effect (meaning this test can be used) for the duration of the COVID-19 declaration under Section 56 4(b)(1) of the Act, 21 U.S.C. section 360bbb-3(b)(1), unless the authorization is terminated or revoked sooner. Performed at [facility], [address], [place], [place] [address]      Studies: CT ABDOMEN PELVIS W CONTRAST  Result Date: 08/10/2019 CLINICAL DATA:   Abdominal pain and nausea EXAM: CT ABDOMEN AND PELVIS WITH CONTRAST TECHNIQUE: Multidetector CT imaging of the abdomen and pelvis was performed using the standard protocol following bolus administration of intravenous contrast. CONTRAST:  OMNIPAQUE IOHEXOL 300 MG/ML  SOLN COMPARISON:  Abdominal radiographs August 10, 2019 FINDINGS: Lower chest: Lung bases are clear. Hepatobiliary: There is hepatic steatosis with scattered areas of apparent fatty sparing. There is a 6 mm cyst in the left lobe of the liver anteriorly. Gallbladder wall is not appreciably thickened. There is no biliary duct dilatation. Pancreas: There is no pancreatic mass or inflammatory focus. Spleen: No splenic lesions are evident. Adrenals/Urinary Tract: Adrenals bilaterally appear normal. There is a cyst in the lower pole of the right kidney measuring 1.5 x 1.5 cm. There is a 4 mm probable cyst in the posterior upper pole left kidney. There is no hydronephrosis on either side. There is no evident renal or ureteral calculus on either side. Urinary bladder is midline with wall thickness upper normal. Stomach/Bowel: There is distention of the stomach with fluid and food material. There is small bowel dilatation to the level of the proximal ileum. A focal transition zone is noted in the proximal ileum, best seen on axial slice  69 series 3, felt to be indicative of a degree of small bowel obstruction. Small bowel does not appear appreciably thickened. No free air or portal venous air is evident. The terminal ileum appears unremarkable. Vascular/Lymphatic: No abdominal aortic aneurysm. No arterial vascular calcifications are evident. Major venous structures appear patent. There is no evident adenopathy in the abdomen or pelvis. Reproductive: Uterus is anteverted.  No evident pelvic mass. Other: No appendiceal region inflammation evident. No abscess or ascites evident in the abdomen or pelvis. Musculoskeletal: No blastic or lytic bone lesions are  evident. No intramuscular or abdominal wall lesions are appreciable. IMPRESSION: 1. Evidence of a degree of small bowel obstruction with transition zone in the upper pelvis essentially in the midline, involving proximal ileum. No free air or portal venous air. 2.  No abscess in the abdomen or pelvis.  Appendix appears normal. 3. Urinary bladder wall thickness appears upper normal. It may be prudent to correlate with urinalysis to assess for possible earliest changes of cystitis. Note that there is no hydronephrosis on either side. No renal or ureteral calculus on either side. 4.  Hepatic steatosis. Electronically Signed   By: Bretta Bang III M.D.   On: 08/10/2019 09:27   DG Chest Port 1 View  Result Date: 08/10/2019 CLINICAL DATA:  Nasogastric tube placement EXAM: PORTABLE CHEST 1 VIEW COMPARISON:  October 15, 2022 FINDINGS: Nasogastric tube tip and side port are below the diaphragm with tip not seen. Lungs are clear. Heart size and pulmonary vascularity normal. No adenopathy. No pneumothorax. No bone lesions. There is air adjacent to the inferior left hemidiaphragm, likely in stomach. IMPRESSION: Nasogastric tube tip and side port below diaphragm with distal tube not seen on chest radiograph. Lungs clear. Cardiac silhouette within normal limits. Air immediately adjacent to the left hemidiaphragm is likely in stomach. Gastric border not seen on chest radiograph. This finding may well warrant supine and decubitus abdomen radiographs to further evaluate. Electronically Signed   By: Bretta Bang III M.D.   On: 08/10/2019 11:27   DG Abd 2 Views  Result Date: 08/11/2019 CLINICAL DATA:  Abdominal pain beginning yesterday. Nausea and diarrhea. EXAM: ABDOMEN - 2 VIEW COMPARISON:  08/10/2019 FINDINGS: New nasogastric tube has its tip projecting just within the stomach. Normal bowel gas pattern. The small-bowel dilation with air-fluid levels noted on the previous day's exam is no longer present. No free air.  IMPRESSION: 1. Significant improvement. No residual small bowel dilation or air-fluid levels. No current radiographic evidence of small-bowel obstruction. Electronically Signed   By: Amie Portland M.D.   On: 08/11/2019 06:44   DG ABD ACUTE 2+V W 1V CHEST  Result Date: 08/10/2019 CLINICAL DATA:  Abdominal pain EXAM: DG ABDOMEN ACUTE W/ 1V CHEST COMPARISON:  Abdomen series February 28, 2013 FINDINGS: PA chest: Lungs are clear. Heart size and pulmonary vascularity are normal. No adenopathy. Supine and upright abdomen: There are loops of dilated small bowel with multiple air-fluid levels. No free air evident. No abnormal calcifications. IMPRESSION: Multiple loops of dilated small bowel with air-fluid levels. Concern for a degree of bowel obstruction. No free air. Lungs clear. Electronically Signed   By: Bretta Bang III M.D.   On: 08/10/2019 08:10   Scheduled Meds: . enoxaparin (LOVENOX) injection  70 mg Subcutaneous Q24H  . [START ON 08/12/2019] levothyroxine  100 mcg Oral QAC breakfast  . [START ON 08/12/2019] pantoprazole  40 mg Oral Q0600  . pantoprazole (PROTONIX) IV  40 mg Intravenous Q24H   Continuous  Infusions: . 0.9 % NaCl with KCl 20 mEq / L 50 mL/hr at 08/11/19 1010  . cefTRIAXone (ROCEPHIN)  IV 1 g (08/11/19 0911)    Principal Problem:   SBO (small bowel obstruction) (HCC) Active Problems:   Hypertension   Metabolic syndrome   Class 3 severe obesity with body mass index (BMI) of 45.0 to 49.9 in adult [facility])   Cystitis   UTI (urinary tract infection)   Epilepsy (HCC)   Generalized abdominal pain   History of abdominal surgery   Hypothyroidism  Time spent:   Irwin Brakeman, MD [facility] 08/11/2019, 11:49 AM    LOS: 1 day  How to contact the [facility] Attending or Consulting provider [place] or covering provider during after hours [place], for this patient?  1. Check the care team in [facility] [place] [facility] and look for a) attending/consulting [facility] provider listed and b) the [facility] team  listed 2. Log into [email or web address] and use [place]'s universal password to access. If you do not have the password, please contact the hospital operator. 3. Locate the [facility] provider you are looking for under [facility] and page to a number that you can be directly reached. 4. If you still have difficulty reaching the provider, please page the [facility] (Director on Call) for the Hospitalists listed on amion for assistance.

## 2019-08-12 DIAGNOSIS — E039 Hypothyroidism, unspecified: Secondary | ICD-10-CM

## 2019-08-12 DIAGNOSIS — N3000 Acute cystitis without hematuria: Secondary | ICD-10-CM

## 2019-08-12 DIAGNOSIS — K56609 Unspecified intestinal obstruction, unspecified as to partial versus complete obstruction: Secondary | ICD-10-CM

## 2019-08-12 DIAGNOSIS — I10 Essential (primary) hypertension: Secondary | ICD-10-CM

## 2019-08-12 DIAGNOSIS — N3090 Cystitis, unspecified without hematuria: Secondary | ICD-10-CM

## 2019-08-12 DIAGNOSIS — Z9889 Other specified postprocedural states: Secondary | ICD-10-CM

## 2019-08-12 DIAGNOSIS — R1084 Generalized abdominal pain: Secondary | ICD-10-CM

## 2019-08-12 LAB — CBC WITH DIFFERENTIAL/PLATELET
Abs Immature Granulocytes: 0.02 10*3/uL (ref 0.00–0.07)
Basophils Absolute: 0 10*3/uL (ref 0.0–0.1)
Basophils Relative: 0 %
Eosinophils Absolute: 0.2 10*3/uL (ref 0.0–0.5)
Eosinophils Relative: 3 %
HCT: 39 % (ref 36.0–46.0)
Hemoglobin: 11.9 g/dL — ABNORMAL LOW (ref 12.0–15.0)
Immature Granulocytes: 0 %
Lymphocytes Relative: 27 %
Lymphs Abs: 1.9 10*3/uL (ref 0.7–4.0)
MCH: 28.1 pg (ref 26.0–34.0)
MCHC: 30.5 g/dL (ref 30.0–36.0)
MCV: 92.2 fL (ref 80.0–100.0)
Monocytes Absolute: 0.7 10*3/uL (ref 0.1–1.0)
Monocytes Relative: 10 %
Neutro Abs: 4 10*3/uL (ref 1.7–7.7)
Neutrophils Relative %: 60 %
Platelets: 246 10*3/uL (ref 150–400)
RBC: 4.23 MIL/uL (ref 3.87–5.11)
RDW: 12.8 % (ref 11.5–15.5)
WBC: 6.8 10*3/uL (ref 4.0–10.5)
nRBC: 0 % (ref 0.0–0.2)

## 2019-08-12 LAB — COMPREHENSIVE METABOLIC PANEL
ALT: 28 U/L (ref 0–44)
AST: 16 U/L (ref 15–41)
Albumin: 3.5 g/dL (ref 3.5–5.0)
Alkaline Phosphatase: 50 U/L (ref 38–126)
Anion gap: 7 (ref 5–15)
BUN: 10 mg/dL (ref 6–20)
CO2: 26 mmol/L (ref 22–32)
Calcium: 8.9 mg/dL (ref 8.9–10.3)
Chloride: 107 mmol/L (ref 98–111)
Creatinine, Ser: 0.56 mg/dL (ref 0.44–1.00)
GFR calc Af Amer: >60 mL/min (ref >60)
GFR calc non Af Amer: >60 mL/min (ref >60)
Glucose, Bld: 82 mg/dL (ref 70–99)
Potassium: 3.7 mmol/L (ref 3.5–5.1)
Sodium: 140 mmol/L (ref 135–145)
Total Bilirubin: 0.5 mg/dL (ref 0.3–1.2)
Total Protein: 6.2 g/dL — ABNORMAL LOW (ref 6.5–8.1)

## 2019-08-12 LAB — MAGNESIUM: Magnesium: 2 mg/dL (ref 1.7–2.4)

## 2019-08-12 NOTE — Discharge Summary (Signed)
Physician Discharge Summary  Mercedes Koch BTD:[id number] DOB: 17-Oct-1990 DOA: 08/10/2019  PCP: Danelle Berry, PA-C  Admit date: 08/10/2019 Discharge date: 08/12/2019  Admitted From:  Home  Disposition: Home   Recommendations for Outpatient Follow-up:  1. Follow up with PCP in 1 weeks    Discharge Condition: Stable CODE STATUS: Full    Brief Hospitalization Summary: Please see all hospital notes, images, labs for full details of the hospitalization. HPI: Mercedes Koch is a 29 y.o. female with medical history significant for PCOS, hypothyroidism, epilepsy and hypertension and having history of cesarean section in 2018 presented to the ED from home.  Patient reports she woke up from sleep this morning with severe epigastric abdominal pain and one loose bowel movement associated with nausea but no emesis.  She having severe mid abdominal pain very tender to palpation describing the pain as a sharp shooting pain that is intermittent associated with a constant aching crampy pain.  Reports that she has gallbladder and appendix.  Patient denies fever and chills.  Patient denies sick contacts.  No prior history of bowel obstruction.  ED Course: Afebrile vital signs stable on arrival, acute abdominal series with findings of distended bowel loops with concerns for bowel obstruction.  Urinalysis positive for infection.  CT abdomen confirms bowel obstruction with transition point.  No abscess no free air noted.  Normal gallbladder and appendix.  Patient was ordered for NG tube placement and started on IV antibiotics and admission was requested. Brief Admission Hx: 29 y.o.femalewith medical history significantfor PCOS, hypothyroidism, epilepsy and hypertension and having history of cesarean section in 2018 presented to the ED from home. Patient reports she woke up from sleep this morning with severe epigastric abdominal pain and one loose bowel movement associated with nausea but no emesis. She  having severe mid abdominal pain very tender to palpation describing the pain as a sharp shooting pain that is intermittent associated with a constant aching crampy pain. Reports that she has gallbladder and appendix. Patient denies fever and chills. Patient denies sick contacts. No prior history of bowel obstruction.  MDM/Assessment & Plan:   1. Small bowel obstruction with transition point-likely secondary to abdominal adhesions from cesarean section from 2018. s/p NG tube placement, low intermittent suction.     NG was removed on 08/12/2019 and patient has tolerated advancing diet from clear liquids to full liquids and now soft diet.  She has had a bowel movement today.  Her abdominal pain is resolved and she is feeling much better.  Repeat abdominal x-rays show marked improvement to resolution of the small bowel obstruction.  Discharge home.  Soft diet recommended for 1 week.  Return if symptoms come back. 2. E. coli cystitis-acute symptomatic treated with IV ceftriaxone x3 doses. 3. Hypothyroidism-resume oral levothyroxine dose from home. 4. Generalized abdominal pain-resolved.  Secondary to SBO. 5. Essential hypertension-blood pressures well controlled at this time consider IV as needed treatment if needed for elevated BP readings. 6. Leukocytosis - RESOLVED. WBC normal now.  Was likely elevated from acute cystitis and stress reaction, follow CBC with diff.  DVT prophylaxis:Enoxaparin Code Status:Full Family Communication:Plan of care discussed with patient at bedside who verbalized understanding. Disposition Plan:Home Consults called:N/A Admission status:OBS  Discharge Diagnoses:  Principal Problem:   SBO (small bowel obstruction) (HCC) Active Problems:   Hypertension   Metabolic syndrome   Class 3 severe obesity with body mass index (BMI) of 45.0 to 49.9 in adult [facility])   Cystitis   UTI (urinary tract  infection)   Epilepsy ([place])   Generalized abdominal pain   History  of abdominal surgery   Hypothyroidism   Discharge Instructions:  Allergies as of 08/12/2019   No Known Allergies     Medication List    TAKE these medications   levothyroxine 100 MCG tablet Commonly known as: SYNTHROID Take 1 tablet (100 mcg total) by mouth daily.   Vitamin D (Ergocalciferol) 1.25 MG (50000 UNIT) Caps capsule Commonly known as: DRISDOL Take 1 capsule (50,000 Units total) by mouth every 7 (seven) days. x12 weeks.      Follow-up Information    Delsa Grana, PA-C. Schedule an appointment as soon as possible for a visit in 1 week(s).   Specialty: Family Medicine Contact information: [address] [place] [address] [phone]          No Known Allergies Allergies as of 08/12/2019   No Known Allergies     Medication List    TAKE these medications   levothyroxine 100 MCG tablet Commonly known as: SYNTHROID Take 1 tablet (100 mcg total) by mouth daily.   Vitamin D (Ergocalciferol) 1.25 MG (50000 UNIT) Caps capsule Commonly known as: DRISDOL Take 1 capsule (50,000 Units total) by mouth every 7 (seven) days. x12 weeks.       Procedures/Studies: CT ABDOMEN PELVIS W CONTRAST  Result Date: 08/10/2019 CLINICAL DATA:  Abdominal pain and nausea EXAM: CT ABDOMEN AND PELVIS WITH CONTRAST TECHNIQUE: Multidetector CT imaging of the abdomen and pelvis was performed using the standard protocol following bolus administration of intravenous contrast. CONTRAST:  137mL OMNIPAQUE IOHEXOL 300 MG/ML  SOLN COMPARISON:  Abdominal radiographs August 10, 2019 FINDINGS: Lower chest: Lung bases are clear. Hepatobiliary: There is hepatic steatosis with scattered areas of apparent fatty sparing. There is a 6 mm cyst in the left lobe of the liver anteriorly. Gallbladder wall is not appreciably thickened. There is no biliary duct dilatation. Pancreas: There is no pancreatic mass or inflammatory focus. Spleen: No splenic lesions are evident. Adrenals/Urinary Tract:  Adrenals bilaterally appear normal. There is a cyst in the lower pole of the right kidney measuring 1.5 x 1.5 cm. There is a 4 mm probable cyst in the posterior upper pole left kidney. There is no hydronephrosis on either side. There is no evident renal or ureteral calculus on either side. Urinary bladder is midline with wall thickness upper normal. Stomach/Bowel: There is distention of the stomach with fluid and food material. There is small bowel dilatation to the level of the proximal ileum. A focal transition zone is noted in the proximal ileum, best seen on axial slice 69 series 3, felt to be indicative of a degree of small bowel obstruction. Small bowel does not appear appreciably thickened. No free air or portal venous air is evident. The terminal ileum appears unremarkable. Vascular/Lymphatic: No abdominal aortic aneurysm. No arterial vascular calcifications are evident. Major venous structures appear patent. There is no evident adenopathy in the abdomen or pelvis. Reproductive: Uterus is anteverted.  No evident pelvic mass. Other: No appendiceal region inflammation evident. No abscess or ascites evident in the abdomen or pelvis. Musculoskeletal: No blastic or lytic bone lesions are evident. No intramuscular or abdominal wall lesions are appreciable. IMPRESSION: 1. Evidence of a degree of small bowel obstruction with transition zone in the upper pelvis essentially in the midline, involving proximal ileum. No free air or portal venous air. 2.  No abscess in the abdomen or pelvis.  Appendix appears normal. 3. Urinary bladder wall thickness  appears upper normal. It may be prudent to correlate with urinalysis to assess for possible earliest changes of cystitis. Note that there is no hydronephrosis on either side. No renal or ureteral calculus on either side. 4.  Hepatic steatosis. Electronically Signed   By: Bretta BangWilliam  Woodruff III M.D.   On: 08/10/2019 09:27   DG Chest Port 1 View  Result Date:  08/10/2019 CLINICAL DATA:  Nasogastric tube placement EXAM: PORTABLE CHEST 1 VIEW COMPARISON:  October 15, 2022 FINDINGS: Nasogastric tube tip and side port are below the diaphragm with tip not seen. Lungs are clear. Heart size and pulmonary vascularity normal. No adenopathy. No pneumothorax. No bone lesions. There is air adjacent to the inferior left hemidiaphragm, likely in stomach. IMPRESSION: Nasogastric tube tip and side port below diaphragm with distal tube not seen on chest radiograph. Lungs clear. Cardiac silhouette within normal limits. Air immediately adjacent to the left hemidiaphragm is likely in stomach. Gastric border not seen on chest radiograph. This finding may well warrant supine and decubitus abdomen radiographs to further evaluate. Electronically Signed   By: Bretta BangWilliam  Woodruff III M.D.   On: 08/10/2019 11:27   DG Abd 2 Views  Result Date: 08/11/2019 CLINICAL DATA:  Abdominal pain beginning yesterday. Nausea and diarrhea. EXAM: ABDOMEN - 2 VIEW COMPARISON:  08/10/2019 FINDINGS: New nasogastric tube has its tip projecting just within the stomach. Normal bowel gas pattern. The small-bowel dilation with air-fluid levels noted on the previous day's exam is no longer present. No free air. IMPRESSION: 1. Significant improvement. No residual small bowel dilation or air-fluid levels. No current radiographic evidence of small-bowel obstruction. Electronically Signed   By: Amie Portlandavid  Ormond M.D.   On: 08/11/2019 06:44   DG ABD ACUTE 2+V W 1V CHEST  Result Date: 08/10/2019 CLINICAL DATA:  Abdominal pain EXAM: DG ABDOMEN ACUTE W/ 1V CHEST COMPARISON:  Abdomen series February 28, 2013 FINDINGS: PA chest: Lungs are clear. Heart size and pulmonary vascularity are normal. No adenopathy. Supine and upright abdomen: There are loops of dilated small bowel with multiple air-fluid levels. No free air evident. No abnormal calcifications. IMPRESSION: Multiple loops of dilated small bowel with air-fluid levels. Concern for a  degree of bowel obstruction. No free air. Lungs clear. Electronically Signed   By: Bretta BangWilliam  Woodruff III M.D.   On: 08/10/2019 08:10      Subjective: Patient is feeling a lot better.  She is tolerating a soft diet and she has had a bowel movement.  Discharge Exam: Vitals:   08/11/19 2138 08/12/19 0450  BP: 100/61 104/74  Pulse: 71 67  Resp: 18 18  Temp: 97.7 F (36.5 C) 98.2 F (36.8 C)  SpO2: 98% 94%   Vitals:   08/11/19 0508 08/11/19 1416 08/11/19 2138 08/12/19 0450  BP: (!) 106/57 (!) 125/101 100/61 104/74  Pulse: 62 85 71 67  Resp: 18 17 18 18   Temp: 97.7 F (36.5 C) 97.8 F (36.6 C) 97.7 F (36.5 C) 98.2 F (36.8 C)  TempSrc: Oral Oral Oral Oral  SpO2: 96% 99% 98% 94%  Weight:      Height:       General: Pt is alert, awake, not in acute distress Cardiovascular: RRR, S1/S2 +, no rubs, no gallops Respiratory: CTA bilaterally, no wheezing, no rhonchi Abdominal: Soft, NT, ND, bowel sounds + Extremities: no edema, no cyanosis   The results of significant diagnostics from this hospitalization (including imaging, microbiology, ancillary and laboratory) are listed below for reference.     Microbiology:  Recent Results (from the past 240 hour(s))  Urine Culture     Status: Abnormal (Preliminary result)   Collection Time: 08/10/19  9:28 AM   Specimen: Urine, Clean Catch  Result Value Ref Range Status   Specimen Description   Final    URINE, CLEAN CATCH Performed at [facility], [address], [place], [place] [address]    Special Requests   Final    NONE Performed at [facility], [address] [place], [place] [address]    Culture (A)  Final    >=100,000 COLONIES/mL ESCHERICHIA COLI SUSCEPTIBILITIES TO FOLLOW Performed at [facility], [address], [place], [place] [address]    Report Status PENDING  Incomplete  SARS CORONAVIRUS 2 (TAT 6-24 HRS) Nasopharyngeal Nasopharyngeal Swab     Status: None   Collection Time: 08/10/19  9:48 AM   Specimen:  Nasopharyngeal Swab  Result Value Ref Range Status   SARS Coronavirus 2 NEGATIVE NEGATIVE Final    Comment: (NOTE) SARS-CoV-2 target nucleic acids are NOT DETECTED. The SARS-CoV-2 RNA is generally detectable in upper and lower respiratory specimens during the acute phase of infection. Negative results do not preclude SARS-CoV-2 infection, do not rule out co-infections with other pathogens, and should not be used as the sole basis for treatment or other patient management decisions. Negative results must be combined with clinical observations, patient history, and epidemiological information. The expected result is Negative. Fact Sheet for Patients: HairSlick.no Fact Sheet for Healthcare Providers: quierodirigir.com This test is not yet approved or cleared by the [place] FDA and  has been authorized for detection and/or diagnosis of SARS-CoV-2 by FDA under an Emergency Use Authorization (EUA). This EUA will remain  in effect (meaning this test can be used) for the duration of the COVID-19 declaration under Section 56 4(b)(1) of the Act, 21 U.S.C. section 360bbb-3(b)(1), unless the authorization is terminated or revoked sooner. Performed at [facility], [address] [place], [place] [address]      Labs: BNP (last 3 results) No results for input(s): BNP in the last 8760 hours. Basic Metabolic Panel: Recent Labs  Lab 08/10/19 0717 08/11/19 0612 08/12/19 0442  NA 139 140 140  K 3.8 3.8 3.7  CL 97* 107 107  CO2 31 24 26   GLUCOSE 112* 92 82  BUN 15 11 10   CREATININE 0.63 0.58 0.56  CALCIUM 9.9 8.4* 8.9  MG  --  2.0 2.0   Liver Function Tests: Recent Labs  Lab 08/10/19 0717 08/11/19 0612 08/12/19 0442  AST 27 20 16   ALT 43 33 28  ALKPHOS 70 55 50  BILITOT 0.7 0.5 0.5  PROT 8.0 6.2* 6.2*  ALBUMIN 4.5 3.4* 3.5   Recent Labs  Lab 08/10/19 0717  LIPASE 19   No results for input(s): AMMONIA in  the last 168 hours. CBC: Recent Labs  Lab 08/10/19 0717 08/11/19 0612 08/12/19 0442  WBC 11.1* 6.2 6.8  NEUTROABS 8.7* 3.6 4.0  HGB 15.3* 12.7 11.9*  HCT 48.6* 41.5 39.0  MCV 91.2 91.8 92.2  PLT 295 257 246   Cardiac Enzymes: No results for input(s): CKTOTAL, CKMB, CKMBINDEX, TROPONINI in the last 168 hours. BNP: Invalid input(s): POCBNP CBG: No results for input(s): GLUCAP in the last 168 hours. D-Dimer No results for input(s): DDIMER in the last 72 hours. Hgb A1c No results for input(s): HGBA1C in the last 72 hours. Lipid Profile No results for input(s): CHOL, HDL, LDLCALC, TRIG, CHOLHDL, LDLDIRECT in the  last 72 hours. Thyroid function studies No results for input(s): TSH, T4TOTAL, T3FREE, THYROIDAB in the last 72 hours.  Invalid input(s): FREET3 Anemia work up No results for input(s): VITAMINB12, FOLATE, FERRITIN, TIBC, IRON, RETICCTPCT in the last 72 hours. Urinalysis    Component Value Date/Time   COLORURINE YELLOW 08/10/2019 0723   APPEARANCEUR HAZY (A) 08/10/2019 0723   LABSPEC 1.021 08/10/2019 0723   PHURINE 6.0 08/10/2019 0723   GLUCOSEU NEGATIVE 08/10/2019 0723   HGBUR NEGATIVE 08/10/2019 0723   BILIRUBINUR NEGATIVE 08/10/2019 0723   KETONESUR NEGATIVE 08/10/2019 0723   PROTEINUR 100 (A) 08/10/2019 0723   UROBILINOGEN 0.2 02/28/2013 1725   NITRITE POSITIVE (A) 08/10/2019 0723   LEUKOCYTESUR TRACE (A) 08/10/2019 0723   Sepsis Labs Invalid input(s): PROCALCITONIN,  WBC,  LACTICIDVEN Microbiology Recent Results (from the past 240 hour(s))  Urine Culture     Status: Abnormal (Preliminary result)   Collection Time: 08/10/19  9:28 AM   Specimen: Urine, Clean Catch  Result Value Ref Range Status   Specimen Description   Final    URINE, CLEAN CATCH Performed at [facility], [address] [place], [place] [address]    Special Requests   Final    NONE Performed at [facility], [address], [place], [place] [address]    Culture (A)  Final     >=100,000 COLONIES/mL ESCHERICHIA COLI SUSCEPTIBILITIES TO FOLLOW Performed at [facility], [address], [place], [place] [address]    Report Status PENDING  Incomplete  SARS CORONAVIRUS 2 (TAT 6-24 HRS) Nasopharyngeal Nasopharyngeal Swab     Status: None   Collection Time: 08/10/19  9:48 AM   Specimen: Nasopharyngeal Swab  Result Value Ref Range Status   SARS Coronavirus 2 NEGATIVE NEGATIVE Final    Comment: (NOTE) SARS-CoV-2 target nucleic acids are NOT DETECTED. The SARS-CoV-2 RNA is generally detectable in upper and lower respiratory specimens during the acute phase of infection. Negative results do not preclude SARS-CoV-2 infection, do not rule out co-infections with other pathogens, and should not be used as the sole basis for treatment or other patient management decisions. Negative results must be combined with clinical observations, patient history, and epidemiological information. The expected result is Negative. Fact Sheet for Patients: HairSlick.no Fact Sheet for Healthcare Providers: quierodirigir.com This test is not yet approved or cleared by the [place] FDA and  has been authorized for detection and/or diagnosis of SARS-CoV-2 by FDA under an Emergency Use Authorization (EUA). This EUA will remain  in effect (meaning this test can be used) for the duration of the COVID-19 declaration under Section 56 4(b)(1) of the Act, 21 U.S.C. section 360bbb-3(b)(1), unless the authorization is terminated or revoked sooner. Performed at [facility], [address], [place], [place] [address]     Time coordinating discharge:   SIGNED:  Standley Dakins, MD  [facility] 08/12/2019, 1:31 PM How to contact the [facility] Attending or Consulting provider 7A - 7P or covering provider during after hours 7P -7A, for this patient?  1. Check the care team in [facility] and look for a) attending/consulting [facility]  provider listed and b) the [facility] team listed 2. Log into [email or web address] and use [place]'s universal password to access. If you do not have the password, please contact the hospital operator. 3. Locate the [facility] provider you are looking for under [facility] and page to a number that you can be directly reached. 4. If you still have difficulty reaching the provider, please  page the [facility] (Director on Call) for the Hospitalists listed on amion for assistance.

## 2019-08-12 NOTE — Discharge Instructions (Signed)
PLEASE CONTINUE SOFT DIET FOR AT LEAST 1 WEEK.  FOLLOW UP WITH YOUR PRIMARY CARE PROVIDER IN 1-2 WEEKS.     IMPORTANT INFORMATION: PAY CLOSE ATTENTION   PHYSICIAN DISCHARGE INSTRUCTIONS  Follow with Primary care provider  Danelle Berry, PA-C  and other consultants as instructed by your Hospitalist Physician  SEEK MEDICAL CARE OR RETURN TO EMERGENCY ROOM IF SYMPTOMS COME BACK, WORSEN OR NEW PROBLEM DEVELOPS   Please note: You were cared for by a hospitalist during your hospital stay. Every effort will be made to forward records to your primary care provider.  You can request that your primary care provider send for your hospital records if they have not received them.  Once you are discharged, your primary care physician will handle any further medical issues. Please note that NO REFILLS for any discharge medications will be authorized once you are discharged, as it is imperative that you return to your primary care physician (or establish a relationship with a primary care physician if you do not have one) for your post hospital discharge needs so that they can reassess your need for medications and monitor your lab values.  Please get a complete blood count and chemistry panel checked by your Primary MD at your next visit, and again as instructed by your Primary MD.  Get Medicines reviewed and adjusted: Please take all your medications with you for your next visit with your Primary MD  Laboratory/radiological data: Please request your Primary MD to go over all hospital tests and procedure/radiological results at the follow up, please ask your primary care provider to get all Hospital records sent to his/her office.  In some cases, they will be blood work, cultures and biopsy results pending at the time of your discharge. Please request that your primary care provider follow up on these results.  If you are diabetic, please bring your blood sugar readings with you to your follow up  appointment with primary care.    Please call and make your follow up appointments as soon as possible.    Also Note the following: If you experience worsening of your admission symptoms, develop shortness of breath, life threatening emergency, suicidal or homicidal thoughts you must seek medical attention immediately by calling 911 or calling your MD immediately  if symptoms less severe.  You must read complete instructions/literature along with all the possible adverse reactions/side effects for all the Medicines you take and that have been prescribed to you. Take any new Medicines after you have completely understood and accpet all the possible adverse reactions/side effects.   Do not drive when taking Pain medications or sleeping medications (Benzodiazepines)  Do not take more than prescribed Pain, Sleep and Anxiety Medications. It is not advisable to combine anxiety,sleep and pain medications without talking with your primary care practitioner  Special Instructions: If you have smoked or chewed Tobacco  in the last 2 yrs please stop smoking, stop any regular Alcohol  and or any Recreational drug use.  Wear Seat belts while driving.  Do not drive if taking any narcotic, mind altering or controlled substances or recreational drugs or alcohol.

## 2019-08-12 NOTE — Progress Notes (Signed)
Discussed AVS including medications & need for follow up appointments with the patient and all questioned fully answered to the best of my ability. Patient taken with personal belongings to discharge area.

## 2019-08-12 NOTE — Plan of Care (Signed)
  Problem: Activity: Goal: Risk for activity intolerance will decrease Outcome: Progressing   Problem: Nutrition: Goal: Adequate nutrition will be maintained Outcome: Progressing Note: Diet advanced to Soft per order. Tolerated well.

## 2019-08-13 LAB — URINE CULTURE: Culture: >=100000 — AB

## 2019-08-17 DIAGNOSIS — G43109 Migraine with aura, not intractable, without status migrainosus: Secondary | ICD-10-CM | POA: Insufficient documentation

## 2019-08-17 DIAGNOSIS — R569 Unspecified convulsions: Secondary | ICD-10-CM | POA: Insufficient documentation

## 2019-08-17 DIAGNOSIS — R519 Headache, unspecified: Secondary | ICD-10-CM | POA: Insufficient documentation

## 2019-08-19 ENCOUNTER — Ambulatory Visit: Payer: Self-pay | Admitting: Family Medicine

## 2019-08-22 ENCOUNTER — Inpatient Hospital Stay: Payer: Self-pay | Admitting: Family Medicine

## 2019-09-09 ENCOUNTER — Other Ambulatory Visit: Payer: Self-pay

## 2019-09-09 ENCOUNTER — Encounter: Payer: Self-pay | Admitting: Family Medicine

## 2019-09-09 ENCOUNTER — Ambulatory Visit (INDEPENDENT_AMBULATORY_CARE_PROVIDER_SITE_OTHER): Payer: Self-pay | Admitting: Family Medicine

## 2019-09-09 VITALS — BP 124/68 | HR 96 | Temp 97.6°F | Resp 14 | Ht 65.0 in | Wt 284.2 lb

## 2019-09-09 DIAGNOSIS — K760 Fatty (change of) liver, not elsewhere classified: Secondary | ICD-10-CM | POA: Insufficient documentation

## 2019-09-09 DIAGNOSIS — E559 Vitamin D deficiency, unspecified: Secondary | ICD-10-CM | POA: Insufficient documentation

## 2019-09-09 DIAGNOSIS — E282 Polycystic ovarian syndrome: Secondary | ICD-10-CM

## 2019-09-09 DIAGNOSIS — E8881 Metabolic syndrome: Secondary | ICD-10-CM

## 2019-09-09 DIAGNOSIS — E039 Hypothyroidism, unspecified: Secondary | ICD-10-CM

## 2019-09-09 DIAGNOSIS — I10 Essential (primary) hypertension: Secondary | ICD-10-CM

## 2019-09-09 DIAGNOSIS — Z6841 Body Mass Index (BMI) 40.0 and over, adult: Secondary | ICD-10-CM

## 2019-09-09 DIAGNOSIS — Z0000 Encounter for general adult medical examination without abnormal findings: Secondary | ICD-10-CM

## 2019-09-09 LAB — TSH: TSH: 5.36 mIU/L — ABNORMAL HIGH

## 2019-09-09 MED ORDER — METFORMIN HCL 500 MG PO TABS: 500.0000 mg | ORAL_TABLET | Freq: Two times a day (BID) | ORAL | 1 refills | Status: DC

## 2019-09-09 NOTE — Patient Instructions (Signed)
Preventive Care 21-30 Years Old, Female Preventive care refers to visits with your health care provider and lifestyle choices that can promote health and wellness. This includes:  A yearly physical exam. This may also be called an annual well check.  Regular dental visits and eye exams.  Immunizations.  Screening for certain conditions.  Healthy lifestyle choices, such as eating a healthy diet, getting regular exercise, not using drugs or products that contain nicotine and tobacco, and limiting alcohol use. What can I expect for my preventive care visit? Physical exam Your health care provider will check your:  Height and weight. This may be used to calculate body mass index (BMI), which tells if you are at a healthy weight.  Heart rate and blood pressure.  Skin for abnormal spots. Counseling Your health care provider may ask you questions about your:  Alcohol, tobacco, and drug use.  Emotional well-being.  Home and relationship well-being.  Sexual activity.  Eating habits.  Work and work environment.  Method of birth control.  Menstrual cycle.  Pregnancy history. What immunizations do I need?  Influenza (flu) vaccine  This is recommended every year. Tetanus, diphtheria, and pertussis (Tdap) vaccine  You may need a Td booster every 10 years. Varicella (chickenpox) vaccine  You may need this if you have not been vaccinated. Human papillomavirus (HPV) vaccine  If recommended by your health care provider, you may need three doses over 6 months. Measles, mumps, and rubella (MMR) vaccine  You may need at least one dose of MMR. You may also need a second dose. Meningococcal conjugate (MenACWY) vaccine  One dose is recommended if you are age 19-21 years and a first-year college student living in a residence hall, or if you have one of several medical conditions. You may also need additional booster doses. Pneumococcal conjugate (PCV13) vaccine  You may need  this if you have certain conditions and were not previously vaccinated. Pneumococcal polysaccharide (PPSV23) vaccine  You may need one or two doses if you smoke cigarettes or if you have certain conditions. Hepatitis A vaccine  You may need this if you have certain conditions or if you travel or work in places where you may be exposed to hepatitis A. Hepatitis B vaccine  You may need this if you have certain conditions or if you travel or work in places where you may be exposed to hepatitis B. Haemophilus influenzae type b (Hib) vaccine  You may need this if you have certain conditions. You may receive vaccines as individual doses or as more than one vaccine together in one shot (combination vaccines). Talk with your health care provider about the risks and benefits of combination vaccines. What tests do I need?  Blood tests  Lipid and cholesterol levels. These may be checked every 5 years starting at age 20.  Hepatitis C test.  Hepatitis B test. Screening  Diabetes screening. This is done by checking your blood sugar (glucose) after you have not eaten for a while (fasting).  Sexually transmitted disease (STD) testing.  BRCA-related cancer screening. This may be done if you have a family history of breast, ovarian, tubal, or peritoneal cancers.  Pelvic exam and Pap test. This may be done every 3 years starting at age 21. Starting at age 30, this may be done every 5 years if you have a Pap test in combination with an HPV test. Talk with your health care provider about your test results, treatment options, and if necessary, the need for more tests.   Follow these instructions at home: Eating and drinking   Eat a diet that includes fresh fruits and vegetables, whole grains, lean protein, and low-fat dairy.  Take vitamin and mineral supplements as recommended by your health care provider.  Do not drink alcohol if: ? Your health care provider tells you not to drink. ? You are  pregnant, may be pregnant, or are planning to become pregnant.  If you drink alcohol: ? Limit how much you have to 0-1 drink a day. ? Be aware of how much alcohol is in your drink. In the [place] one drink equals one 12 oz bottle of beer (355 mL), one 5 oz glass of wine (148 mL), or one 1 oz glass of hard liquor (44 mL). Lifestyle  Take daily care of your teeth and gums.  Stay active. Exercise for at least 30 minutes on 5 or more days each week.  Do not use any products that contain nicotine or tobacco, such as cigarettes, e-cigarettes, and chewing tobacco. If you need help quitting, ask your health care provider.  If you are sexually active, practice safe sex. Use a condom or other form of birth control (contraception) in order to prevent pregnancy and STIs (sexually transmitted infections). If you plan to become pregnant, see your health care provider for a preconception visit. What's next?  Visit your health care provider once a year for a well check visit.  Ask your health care provider how often you should have your eyes and teeth checked.  Stay up to date on all vaccines. This information is not intended to replace advice given to you by your health care provider. Make sure you discuss any questions you have with your health care provider. Document Revised: 01/04/2018 Document Reviewed: 01/04/2018 [employer] Patient Education  2020 [employer].

## 2019-09-09 NOTE — Progress Notes (Signed)
Patient: Mercedes Koch, Female    DOB: May 19, 1990, 29 y.o.   MRN: [id number] Delsa Grana, PA-C Visit Date: 09/09/2019  Today's Provider: Delsa Grana, PA-C   Chief Complaint  Patient presents with  . Annual Exam   Subjective:   Annual physical exam:  Mercedes Koch is a 29 y.o. female who presents today for complete physical exam:  Exercise/Activity:  Switched jobs and she walks a lot with new job, and she is going to [employer] on her days off Diet/nutrition:  Changed diet -  Grills more foods, pushing water, cut out junk food Sleep:  Sleeping pretty good, gets about 5 hours a night  Hx of migraines and seizures, pt was new to est here earlier this year, she did get in with Neurology, first medication they tried causes severe HA's, she doesn't remember the name.  She also did EEG, and is due to see neuro on 5/19th or so to review results.  Currently not on any medications for HA  Last week she missed a hospital follow-up visit she went to the hospital severe abdominal pain waking her from her sleep she was subsequently diagnosed with a small bowel obstruction was admitted to the hospital and had surgery.  Her SBO was caused from adhesions from a C-section.  She has recovered well and is doing well without any concerns at her incision sites with bloating or postop pain.  She has even been losing some weight with all of her diet and exercising efforts.  Hypothyroidism: Current Medication Regimen: 100 mcg synthroid -patient established care here in February TSH was very high and medications were started at that time at 25 mcg dose daily for a week with gradual increasing until she had reached 100 mcg daily.  She did not have any side effects or concerns with the small incremental dose increases. Takes medicine in the morning on empty stomach. Current Symptoms: denies fatigue, weight changes, heat/cold intolerance, bowel/skin changes or CVS symptoms Most recent results are  below; we will be repeating labs today. Lab Results  Component Value Date   TSH 8.04 (H) 06/10/2019   Patient's labs from her last routine office visit when she established care here also showed prediabetes with A1c of 5.8, high lipid panel, very low vitamin D with a level of 10, high TSH, and mildly elevated ALT Has known history of PCOS and has struggled with metabolic syndrome, insulin resistance and infertility.  Initial visit also discussed the following-HPI from 06/10/2019: Hx of PCOS per Sharyne Peach - on care everywhere only as of 2019 -no recent labs available in care everywhere  She had her daughter via C-section June 2018   Patient is concerned about weight gain and severe fatigue  She states she gained about 50 lbs during pregnancy 2017 -2018, lost all weight after delivery while nursing and was back to her baseline weight 160-180.  after stopping breast feeding gained back a lot of weight even having weight higher than her top weight in pregnancy.  She states she is hungry all the time, she is sleeping mostly pretty good with her daughter, she does work 40 hours, has family members and her husband helping care for her daughter.  She tries to exercise but not as much as she would like to especially over the last year.  She endorses severe fatigued.  Also states her hair has thinned and skin has become dry over the past couple years. She endorses good moods does not feel  depressed or down, she has no lack of interest in things do not feel that she is a failure or disappointment, did not have any postpartum depression. She has never been to a dietitian or to any weight management specialist. No family history of thyroid disease or MDD  Mom with hx of MI very young she has had 2 heart attacks possibly when she was 75 or 22  Labs from that visit did show new hypothyroid and we started treatment at that time, she has had increased energy she has been able to lose some weight and is  feeling generally a bit better barring her hospital visits.  Hospitalization on August 10, 2019 CT scan of the abdomen pelvis was significant for the following findings: IMPRESSION: 1. Evidence of a degree of small bowel obstruction with transition zone in the upper pelvis essentially in the midline, involving proximal ileum. No free air or portal venous air.  2.  No abscess in the abdomen or pelvis.  Appendix appears normal.  3. Urinary bladder wall thickness appears upper normal. It may be prudent to correlate with urinalysis to assess for possible earliest changes of cystitis. Note that there is no hydronephrosis on either side. No renal or ureteral calculus on either side.  4.  Hepatic steatosis.  She did have a UTI from E. Coli, her small bowel obstruction resolved We will note her hepatic steatosis in her chart, she had incidental bright kidney cyst and 6 mm left liver lobe anterior cyst.  USPSTF grade A and B recommendations - reviewed and addressed today  Depression:  Phq 9 completed today by patient, was reviewed by me with patient in the room PHQ score is neg, pt feels good -depression screening improved with treatment of hypothyroid appetite and energy are significantly better PHQ 2/9 Scores 09/09/2019 06/10/2019  PHQ - 2 Score 0 0  PHQ- 9 Score 1 5   Depression screen [facility] 2/9 09/09/2019 06/10/2019  Decreased Interest 0 0  Down, Depressed, Hopeless 0 0  PHQ - 2 Score 0 0  Altered sleeping 0 0  Tired, decreased energy 1 3  Change in appetite 0 2  Feeling bad or failure about yourself  0 0  Trouble concentrating 0 0  Moving slowly or fidgety/restless 0 0  Suicidal thoughts 0 0  PHQ-9 Score 1 5  Difficult doing work/chores Not difficult at all Not difficult at all    Alcohol screening:   Office Visit from 09/09/2019 in [facility]  AUDIT-C Score  0      Immunizations and Health Maintenance: Health Maintenance  Topic Date Due  . COVID-19 Vaccine  (1) Never done  . INFLUENZA VACCINE  12/08/2019  . PAP-Cervical Cytology Screening  05/09/2020  . PAP SMEAR-Modifier  05/09/2020  . TETANUS/TDAP  11/01/2026  . HIV Screening  Completed     Hep C Screening: not indicated  STD testing and prevention (HIV/chl/gon/syphilis):  see above, no additional testing desired by pt today - done, none wanted today   Intimate partner violence:safe, denies abuse  Sexual History/Pain during Intercourse: Married  Menstrual History/LMP/Abnormal Bleeding: no problems or concerns, has stayed very irregular  Incontinence Symptoms:  none  Breast cancer: Breast cancer history mother, paternal grandmother and maternal grandmother Last Mammogram: *see HM list above BRCA gene screening: No known gene testing in the past  Cervical cancer screening: due -patient states she [facility] [place] Pt positive family history regarding breast cancer  as previously noted no known uterine ovarian or colon cancer.   Osteoporosis:   Discussion on osteoporosis per age, including high calcium and vitamin D supplementation, weight bearing exercises  Skin cancer:  Hx of skin CA -  NO Discussed atypical lesions   Colorectal cancer:   Colonoscopy is none due yet due to age Discussed concerning signs and sx of CRC, pt denies melena, hematochezia   Lung cancer:   Low Dose CT Chest recommended if Age 75-80 years, 30 pack-year currently smoking OR have quit w/in 15years. Patient does not qualify.    Social History   Tobacco Use  . Smoking status: Never Smoker  . Smokeless tobacco: Never Used  Substance Use Topics  . Alcohol use: No  . Drug use: No       Office Visit from 09/09/2019 in [facility]  AUDIT-C Score  0      Family History  Problem Relation Age of Onset  . Hypertension Mother   . Hyperlipidemia Mother   . Osteoporosis Mother   . Fibromyalgia Mother   . Breast cancer Mother   . Heart attack  Mother 82  . Hypertension Father   . Hyperlipidemia Father   . Breast cancer Maternal Grandmother   . Heart failure Maternal Grandmother   . Hyperlipidemia Maternal Grandmother   . Hypertension Maternal Grandmother   . Pneumonia Maternal Grandfather   . Dementia Maternal Grandfather   . Alzheimer's disease Maternal Grandfather   . Hypertension Maternal Grandfather   . Breast cancer Paternal Grandmother   . Stroke Paternal Grandmother   . Kidney failure Paternal Grandfather   . Prostate cancer Paternal Grandfather     Blood pressure/Hypertension: BP Readings from Last 3 Encounters:  09/09/19 124/68  08/12/19 104/74  06/10/19 126/78   Weight/Obesity: Wt Readings from Last 3 Encounters:  09/09/19 284 lb 3.2 oz (128.9 kg)  08/10/19 298 lb 4.5 oz (135.3 kg)  06/10/19 298 lb 3.2 oz (135.3 kg)   BMI Readings from Last 3 Encounters:  09/09/19 47.29 kg/m  08/10/19 49.64 kg/m  06/10/19 49.62 kg/m    Lipids:  Lab Results  Component Value Date   CHOL 178 06/10/2019   Lab Results  Component Value Date   HDL 36 (L) 06/10/2019   Lab Results  Component Value Date   LDLCALC 109 (H) 06/10/2019   Lab Results  Component Value Date   TRIG 211 (H) 06/10/2019   Lab Results  Component Value Date   CHOLHDL 4.9 06/10/2019   No results found for: LDLDIRECT Based on the results of lipid panel his/her cardiovascular risk factor ( using [place] )  in the next 10 years is: The ASCVD Risk score Mikey Bussing DC Jr., et al., 2013) failed to calculate for the following reasons:   The 2013 ASCVD risk score is only valid for ages 31 to 41 Glucose:  Glucose, Bld  Date Value Ref Range Status  08/12/2019 82 70 - 99 mg/dL Final    Comment:    Glucose reference range applies only to samples taken after fasting for at least 8 hours.  08/11/2019 92 70 - 99 mg/dL Final    Comment:    Glucose reference range applies only to samples taken after fasting for at least 8 hours.  08/10/2019 112 (H)  70 - 99 mg/dL Final    Comment:    Glucose reference range applies only to samples taken after fasting for at least 8 hours.   Hypertension: BP Readings from  Last 3 Encounters:  09/09/19 124/68  08/12/19 104/74  06/10/19 126/78   Obesity: Wt Readings from Last 3 Encounters:  09/09/19 284 lb 3.2 oz (128.9 kg)  08/10/19 298 lb 4.5 oz (135.3 kg)  06/10/19 298 lb 3.2 oz (135.3 kg)   BMI Readings from Last 3 Encounters:  09/09/19 47.29 kg/m  08/10/19 49.64 kg/m  06/10/19 49.62 kg/m    Advanced Care Planning:  A voluntary discussion about advance care planning including the explanation and discussion of advance directives.   Discussed health care proxy and Living will, and the patient was able to identify a health care proxy as  Patient does not have a living will at present time.   Social History      She        Social History   Socioeconomic History  . Marital status: Married    Spouse name: jose  . Number of children: 1  . Years of education: 11  . Highest education level: Some college, no degree  Occupational History  . Occupation: Psychologist, clinical  Tobacco Use  . Smoking status: Never Smoker  . Smokeless tobacco: Never Used  Substance and Sexual Activity  . Alcohol use: No  . Drug use: No  . Sexual activity: Yes  Other Topics Concern  . Not on file  Social History Narrative  . Not on file   Social Determinants of Health   Financial Resource Strain:   . Difficulty of Paying Living Expenses:   Food Insecurity:   . Worried About Charity fundraiser in the Last Year:   . Arboriculturist in the Last Year:   Transportation Needs:   . Film/video editor (Medical):   Marland Kitchen Lack of Transportation (Non-Medical):   Physical Activity:   . Days of Exercise per Week:   . Minutes of Exercise per Session:   Stress:   . Feeling of Stress :   Social Connections:   . Frequency of Communication with Friends and Family:   . Frequency of Social Gatherings with Friends  and Family:   . Attends Religious Services:   . Active Member of Clubs or Organizations:   . Attends Archivist Meetings:   Marland Kitchen Marital Status:     Family History        Family History  Problem Relation Age of Onset  . Hypertension Mother   . Hyperlipidemia Mother   . Osteoporosis Mother   . Fibromyalgia Mother   . Breast cancer Mother   . Heart attack Mother 73  . Hypertension Father   . Hyperlipidemia Father   . Breast cancer Maternal Grandmother   . Heart failure Maternal Grandmother   . Hyperlipidemia Maternal Grandmother   . Hypertension Maternal Grandmother   . Pneumonia Maternal Grandfather   . Dementia Maternal Grandfather   . Alzheimer's disease Maternal Grandfather   . Hypertension Maternal Grandfather   . Breast cancer Paternal Grandmother   . Stroke Paternal Grandmother   . Kidney failure Paternal Grandfather   . Prostate cancer Paternal Grandfather     Patient Active Problem List   Diagnosis Date Noted  . SBO (small bowel obstruction) ([place]) 08/10/2019  . Cystitis 08/10/2019  . UTI (urinary tract infection) 08/10/2019  . Epilepsy ([place])   . Generalized abdominal pain   . History of abdominal surgery   . Hypothyroidism   . History of anemia 06/10/2019  . Hypertension 06/10/2019  . Metabolic syndrome 40/98/1191  . PCOS (polycystic ovarian syndrome)  06/10/2019  . Secondary oligomenorrhea 06/10/2019  . Class 3 severe obesity with body mass index (BMI) of 45.0 to 49.9 in adult [facility]) 06/10/2019    Past Surgical History:  Procedure Laterality Date  . CESAREAN SECTION  2018     Current Outpatient Medications:  .  levothyroxine (SYNTHROID) 100 MCG tablet, Take 1 tablet (100 mcg total) by mouth daily., Disp: 90 tablet, Rfl: 3 .  Vitamin D, Ergocalciferol, (DRISDOL) 1.25 MG (50000 UNIT) CAPS capsule, Take 1 capsule (50,000 Units total) by mouth every 7 (seven) days. x12 weeks. (Patient not taking: Reported on 09/09/2019), Disp: 12 capsule, Rfl: 0  No  Known Allergies  Patient Care Team: Delsa Grana, PA-C as PCP - General (Family Medicine)  Review of Systems  Constitutional: Negative.   HENT: Negative.   Eyes: Negative.   Respiratory: Negative.   Cardiovascular: Negative.   Gastrointestinal: Negative.   Endocrine: Negative.   Genitourinary: Negative.   Musculoskeletal: Negative.   Skin: Negative.   Allergic/Immunologic: Negative.   Neurological: Negative.   Hematological: Negative.   Psychiatric/Behavioral: Negative.   All other systems reviewed and are negative.   I have reviewed the patient's medical history in detail and updated the computerized patient record.         Objective:   Vitals:  Vitals:   09/09/19 0920  BP: 124/68  Pulse: 96  Resp: 14  Temp: 97.6 F (36.4 C)  SpO2: 96%  Weight: 284 lb 3.2 oz (128.9 kg)  Height: 5' 5"  (1.651 m)    Body mass index is 47.29 kg/m.  Physical Exam Vitals and nursing note reviewed.  Constitutional:      General: She is not in acute distress.    Appearance: Normal appearance. She is well-developed. She is obese. She is not ill-appearing, toxic-appearing or diaphoretic.  HENT:     Head: Normocephalic and atraumatic.  Eyes:     General:        Right eye: No discharge.        Left eye: No discharge.     Conjunctiva/sclera: Conjunctivae normal.  Neck:     Thyroid: No thyroid mass, thyromegaly or thyroid tenderness.     Trachea: No tracheal deviation.  Cardiovascular:     Rate and Rhythm: Normal rate and regular rhythm.     Pulses: Normal pulses.     Heart sounds: Normal heart sounds. No murmur. No friction rub. No gallop.   Pulmonary:     Effort: Pulmonary effort is normal. No respiratory distress.     Breath sounds: No stridor.  Abdominal:     General: Abdomen is flat. Bowel sounds are normal. There is no distension.     Palpations: Abdomen is soft.  Musculoskeletal:        General: Normal range of motion.     Cervical back: Normal range of motion.      Right lower leg: No edema.     Left lower leg: No edema.  Skin:    Capillary Refill: Capillary refill takes less than 2 seconds.     Coloration: Skin is not jaundiced or pale.     Findings: No rash.  Neurological:     Mental Status: She is alert.  Psychiatric:        Mood and Affect: Mood normal.        Behavior: Behavior normal.       Fall Risk: Fall Risk  09/09/2019 06/10/2019  Falls in the past year? 0 0  Number falls in past  yr: 0 0  Injury with Fall? 0 0    Functional Status Survey: Is the patient deaf or have difficulty hearing?: No Does the patient have difficulty seeing, even when wearing glasses/contacts?: No Does the patient have difficulty concentrating, remembering, or making decisions?: No Does the patient have difficulty walking or climbing stairs?: No Does the patient have difficulty dressing or bathing?: No Does the patient have difficulty doing errands alone such as visiting a doctor's office or shopping?: No   Assessment & Plan:    CPE completed today  . USPSTF grade A and B recommendations reviewed with patient; age-appropriate recommendations, preventive care, screening tests, etc discussed and encouraged; healthy living encouraged; see AVS for patient education given to patient  . Discussed importance of 150 minutes of physical activity weekly, AHA exercise recommendations given to pt in AVS/handout  . Discussed importance of healthy diet:  eating lean meats and proteins, avoiding trans fats and saturated fats, avoid simple sugars and excessive carbs in diet, eat 6 servings of fruit/vegetables daily and drink plenty of water and avoid sweet beverages.    . Recommended pt to do annual eye exam and routine dental exams/cleanings  . Depression, alcohol, fall screening completed as documented above and per flowsheets  . Reviewed Health Maintenance: Health Maintenance  Topic Date Due  . COVID-19 Vaccine (1) Never done  . INFLUENZA VACCINE  12/08/2019  .  PAP-Cervical Cytology Screening  05/09/2020  . PAP SMEAR-Modifier  05/09/2020  . TETANUS/TDAP  11/01/2026  . HIV Screening  Completed    . Immunizations: Immunization History  Administered Date(s) Administered  . Tdap 05/09/2016        ICD-10-CM   1. Adult general medical exam  Z00.00    Completed, patient deferred breast exam and pelvic and Pap to her OB/GYN  2. Hypothyroidism, unspecified type  E03.9 TSH   New diagnosis, patient started medications 3 months ago recheck TSH today, does seem to have improved mood energy and less fatigue  3. Class 3 severe obesity with body mass index (BMI) of 45.0 to 49.9 in adult, unspecified obesity type, unspecified whether serious comorbidity present (HCC)  E66.01 metFORMIN (GLUCOPHAGE) 500 MG tablet   Z68.42    With hepatic steatosis, hyperlipidemia, prediabetes, metabolic syndrome, PCOS -patient working on diet and exercise, starting Metformin  4. PCOS (polycystic ovarian syndrome)  E28.2 metFORMIN (GLUCOPHAGE) 500 MG tablet   Encourage patient to focus on low carb, sugar and low glycemic index diet increase exercise, trial of metformin  5. Hypertension, unspecified type  I10    Hypertension currently managed with lifestyle and monitoring since her pregnancy-induced hypertension, well controlled today  6. Metabolic syndrome  E52.77 metFORMIN (GLUCOPHAGE) 500 MG tablet   Dyslipidemia, prediabetes, increased waist circumference and BMI of 47 -patient making progress with lifestyle diet and exercise efforts, try Metformin  7. Vitamin D deficiency  E55.9    Vitamin D was 10 patient completed prescription but did not start daily over-the-counter supplement  8. Hepatic steatosis  K76.0    Will monitor LFTs and lipid panel     For prediabetes, insulin resistance, metabolic syndrome, morbid obesity patient will start Metformin with her diet and exercise efforts.  She will start with 500 mg once daily for the first 1 to 2 weeks and then will increase to  500 mg twice daily, can increase up to 1000 mg twice daily if she tolerates side effects.  Patient is cash pay and did just do comprehensive work-up when she established  care here about 3 months ago, she subsequently went and was hospitalized and had surgery and labs done in the hospital and ER only about a month ago I did review those labs no glaring concerns this time.  Patient is going to continue her diet and exercise efforts we will check her labs at the end of the summer in about 3 months, and at that time repeat her cholesterol lab A1c etc.  Delsa Grana, PA-C 09/09/19 9:31 AM  [place]

## 2019-09-11 ENCOUNTER — Telehealth: Payer: Self-pay

## 2019-09-11 ENCOUNTER — Other Ambulatory Visit: Payer: Self-pay | Admitting: Family Medicine

## 2019-09-11 DIAGNOSIS — E039 Hypothyroidism, unspecified: Secondary | ICD-10-CM

## 2019-09-11 MED ORDER — LEVOTHYROXINE SODIUM 25 MCG PO TABS: 25.0000 ug | ORAL_TABLET | Freq: Every day | ORAL | 0 refills | Status: DC

## 2019-09-11 NOTE — Telephone Encounter (Signed)
-----   Message from Danelle Berry, [place] sent at 09/11/2019 12:59 PM EDT ----- Please notify pt of labwork  Her TSH was still a little high so we need to continue to increase her levothyroxine dose -  Have her take 25 mcg with 100 mcg for the next week, then increase to 150 mcg daily (take 1 1/2 of her 100 mcg pills and have her let us know when she runs out - we will send in new Rx for 150 mcg and will need to recheck TSH again in 6-8 weeks.  Asher Muir please put in TSH lab, if problems with dose change please f/up with the pharmacy and make sure they understand dosing change and plan

## 2019-10-22 ENCOUNTER — Encounter: Payer: Self-pay | Admitting: Family Medicine

## 2020-02-22 ENCOUNTER — Encounter: Payer: Self-pay | Admitting: Family Medicine

## 2020-03-03 ENCOUNTER — Encounter: Payer: Self-pay | Admitting: Family Medicine

## 2020-03-03 ENCOUNTER — Other Ambulatory Visit: Payer: Self-pay

## 2020-03-03 ENCOUNTER — Ambulatory Visit: Payer: Self-pay | Admitting: Family Medicine

## 2020-03-03 VITALS — BP 122/78 | HR 73 | Temp 98.1°F | Resp 16 | Ht 65.0 in | Wt 280.3 lb

## 2020-03-03 DIAGNOSIS — R7303 Prediabetes: Secondary | ICD-10-CM

## 2020-03-03 DIAGNOSIS — E039 Hypothyroidism, unspecified: Secondary | ICD-10-CM

## 2020-03-03 NOTE — Patient Instructions (Signed)
Restart your levothyroxine  OK to restart lower dose - take 1/2 tablet first thing in the morning on an empty stomach 50 mcg for a week or so and then resume 100 mcg dose daily.  Monitor your symptoms or side effects.  Get your TSH rechecked after 6 weeks back on meds and then we will need to do a quick follow up to discuss the lab result and any symptoms back on the meds.   Call [facility] to find out cash price for the TSH lab You can do labs here if you want - just let me know and I'll order quest TSH here prior to your follow up appointment

## 2020-03-03 NOTE — Progress Notes (Signed)
Name: Mercedes Koch   MRN: [id number]    DOB: 08/05/1990   Date:03/03/2020       Progress Note  Chief Complaint  Patient presents with  . Hypothyroidism    discuss medication, has not take n in over a month  . Medication Management    metformin for pre-diabetic, has not taken in over a month     Subjective:   Mercedes Koch is a 29 y.o. female, presents to clinic for routine f/up Hx of hypothyroid, has not taken meds for 1 month Lab Results  Component Value Date   TSH 5.36 (H) 09/09/2019  She was started on 25 mcg and gradually increased to 100, she did not like the 100 mcg dose   Prediabetes - was prescribed metformin, not taking either for the past month Lab Results  Component Value Date   HGBA1C 5.8 (H) 06/10/2019       Current Outpatient Medications:  .  levothyroxine (SYNTHROID) 100 MCG tablet, Take 1 tablet (100 mcg total) by mouth daily. (Patient not taking: Reported on 03/03/2020), Disp: 90 tablet, Rfl: 3 .  levothyroxine (SYNTHROID) 25 MCG tablet, Take 1 tablet (25 mcg total) by mouth daily before breakfast. Take with 100 mcg tablet for total of 125 mcg po in the morning for 1 week (Patient not taking: Reported on 03/03/2020), Disp: 7 tablet, Rfl: 0 .  metFORMIN (GLUCOPHAGE) 500 MG tablet, Take 1 tablet (500 mg total) by mouth 2 (two) times daily with a meal. (Patient not taking: Reported on 03/03/2020), Disp: 180 tablet, Rfl: 1  Patient Active Problem List   Diagnosis Date Noted  . Vitamin D deficiency 09/09/2019  . Hepatic steatosis 09/09/2019  . Migraine with visual aura 08/17/2019  . Seizures (HCC) 08/17/2019  . SBO (small bowel obstruction) (HCC) 08/10/2019  . Cystitis 08/10/2019  . UTI (urinary tract infection) 08/10/2019  . Epilepsy (HCC)   . Generalized abdominal pain   . History of abdominal surgery   . Hypothyroidism   . History of anemia 06/10/2019  . Hypertension 06/10/2019  . Metabolic syndrome 06/10/2019  . PCOS (polycystic ovarian  syndrome) 06/10/2019  . Secondary oligomenorrhea 06/10/2019  . Class 3 severe obesity with body mass index (BMI) of 45.0 to 49.9 in adult [facility]) 06/10/2019    Past Surgical History:  Procedure Laterality Date  . CESAREAN SECTION  2018    Family History  Problem Relation Age of Onset  . Hypertension Mother   . Hyperlipidemia Mother   . Osteoporosis Mother   . Fibromyalgia Mother   . Breast cancer Mother   . Heart attack Mother 71  . Hypertension Father   . Hyperlipidemia Father   . Breast cancer Maternal Grandmother   . Heart failure Maternal Grandmother   . Hyperlipidemia Maternal Grandmother   . Hypertension Maternal Grandmother   . Pneumonia Maternal Grandfather   . Dementia Maternal Grandfather   . Alzheimer's disease Maternal Grandfather   . Hypertension Maternal Grandfather   . Breast cancer Paternal Grandmother   . Stroke Paternal Grandmother   . Kidney failure Paternal Grandfather   . Prostate cancer Paternal Grandfather     Social History   Tobacco Use  . Smoking status: Never Smoker  . Smokeless tobacco: Never Used  Vaping Use  . Vaping Use: Never used  Substance Use Topics  . Alcohol use: No  . Drug use: No     No Known Allergies  Health Maintenance  Topic Date Due  . Hepatitis C  Screening  Never done  . COVID-19 Vaccine (1) 03/19/2020 (Originally 02/28/2002)  . INFLUENZA VACCINE  08/06/2020 (Originally 12/08/2019)  . PAP SMEAR-Modifier  05/09/2020  . TETANUS/TDAP  11/01/2026  . HIV Screening  Completed    Chart Review Today: I personally reviewed active problem list, medication list, allergies, family history, social history, health maintenance, notes from last encounter, lab results, imaging with the patient/caregiver today.  Review of Systems  10 Systems reviewed and are negative for acute change except as noted in the HPI.  Objective:   Vitals:   03/03/20 0946  BP: 122/78  Pulse: 73  Resp: 16  Temp: 98.1 F (36.7 C)  TempSrc: Oral    SpO2: 99%  Weight: 280 lb 4.8 oz (127.1 kg)  Height: 5\' 5"  (1.651 m)    Body mass index is 46.64 kg/m.  Physical Exam Vitals and nursing note reviewed.  Constitutional:      General: She is not in acute distress.    Appearance: She is obese. She is not toxic-appearing or diaphoretic.  HENT:     Head: Normocephalic and atraumatic.  Eyes:     General: No scleral icterus.       Right eye: No discharge.        Left eye: No discharge.  Cardiovascular:     Rate and Rhythm: Normal rate and regular rhythm.     Pulses: Normal pulses.     Heart sounds: Normal heart sounds.  Pulmonary:     Effort: Pulmonary effort is normal. No respiratory distress.     Breath sounds: Normal breath sounds. No stridor. No wheezing or rales.  Abdominal:     General: Bowel sounds are normal.     Palpations: Abdomen is soft.  Skin:    General: Skin is warm and dry.     Coloration: Skin is not jaundiced or pale.  Neurological:     Mental Status: She is alert. Mental status is at baseline.  Psychiatric:        Mood and Affect: Mood is depressed.        Behavior: Behavior normal.         Assessment & Plan:   1. Hypothyroidism, unspecified type Pt stopped meds, mood worse, encouraged pt to restart but do lower dose and get labs rechecked - TSH  2. Prediabetes Did not start meds  Pt cash pay today, wants to do minimal labs today  Instructions to patient: Restart your levothyroxine  OK to restart lower dose - take 1/2 tablet first thing in the morning on an empty stomach 50 mcg for a week or so and then resume 100 mcg dose daily.  Monitor your symptoms or side effects.  Get your TSH rechecked after 6 weeks back on meds and then we will need to do a quick follow up to discuss the lab result and any symptoms back on the meds.   Call [facility] to find out cash price for the TSH lab You can do labs here if you want - just let me know and I'll order quest TSH here prior to your follow up  appointment   No follow-ups on file.   , PA-C 03/03/20 9:58 AM

## 2020-03-19 DIAGNOSIS — R7303 Prediabetes: Secondary | ICD-10-CM

## 2020-03-19 HISTORY — DX: Prediabetes: R73.03

## 2020-04-14 ENCOUNTER — Other Ambulatory Visit: Payer: Self-pay

## 2020-04-14 ENCOUNTER — Telehealth (INDEPENDENT_AMBULATORY_CARE_PROVIDER_SITE_OTHER): Payer: Self-pay | Admitting: Family Medicine

## 2020-04-14 ENCOUNTER — Encounter: Payer: Self-pay | Admitting: Family Medicine

## 2020-04-14 DIAGNOSIS — Z20822 Contact with and (suspected) exposure to covid-19: Secondary | ICD-10-CM

## 2020-04-14 DIAGNOSIS — E039 Hypothyroidism, unspecified: Secondary | ICD-10-CM

## 2020-04-14 NOTE — Patient Instructions (Signed)
Hypothyroidism  Hypothyroidism is when the thyroid gland does not make enough of certain hormones (it is underactive). The thyroid gland is a small gland located in the lower front part of the neck, just in front of the windpipe (trachea). This gland makes hormones that help control how the body uses food for energy (metabolism) as well as how the heart and brain function. These hormones also play a role in keeping your bones strong. When the thyroid is underactive, it produces too little of the hormones thyroxine (T4) and triiodothyronine (T3). What are the causes? This condition may be caused by:  Hashimoto's disease. This is a disease in which the body's disease-fighting system (immune system) attacks the thyroid gland. This is the most common cause.  Viral infections.  Pregnancy.  Certain medicines.  Birth defects.  Past radiation treatments to the head or neck for cancer.  Past treatment with radioactive iodine.  Past exposure to radiation in the environment.  Past surgical removal of part or all of the thyroid.  Problems with a gland in the center of the brain (pituitary gland).  Lack of enough iodine in the diet. What increases the risk? You are more likely to develop this condition if:  You are female.  You have a family history of thyroid conditions.  You use a medicine called lithium.  You take medicines that affect the immune system (immunosuppressants). What are the signs or symptoms? Symptoms of this condition include:  Feeling as though you have no energy (lethargy).  Not being able to tolerate cold.  Weight gain that is not explained by a change in diet or exercise habits.  Lack of appetite.  Dry skin.  Coarse hair.  Menstrual irregularity.  Slowing of thought processes.  Constipation.  Sadness or depression. How is this diagnosed? This condition may be diagnosed based on:  Your symptoms, your medical history, and a physical exam.  Blood  tests. You may also have imaging tests, such as an ultrasound or MRI. How is this treated? This condition is treated with medicine that replaces the thyroid hormones that your body does not make. After you begin treatment, it may take several weeks for symptoms to go away. Follow these instructions at home:  Take over-the-counter and prescription medicines only as told by your health care provider.  If you start taking any new medicines, tell your health care provider.  Keep all follow-up visits as told by your health care provider. This is important. ? As your condition improves, your dosage of thyroid hormone medicine may change. ? You will need to have blood tests regularly so that your health care provider can monitor your condition. Contact a health care provider if:  Your symptoms do not get better with treatment.  You are taking thyroid replacement medicine and you: ? Sweat a lot. ? Have tremors. ? Feel anxious. ? Lose weight rapidly. ? Cannot tolerate heat. ? Have emotional swings. ? Have diarrhea. ? Feel weak. Get help right away if you have:  Chest pain.  An irregular heartbeat.  A rapid heartbeat.  Difficulty breathing. Summary  Hypothyroidism is when the thyroid gland does not make enough of certain hormones (it is underactive).  When the thyroid is underactive, it produces too little of the hormones thyroxine (T4) and triiodothyronine (T3).  The most common cause is Hashimoto's disease, a disease in which the body's disease-fighting system (immune system) attacks the thyroid gland. The condition can also be caused by viral infections, medicine, pregnancy, or past   radiation treatment to the head or neck.  Symptoms may include weight gain, dry skin, constipation, feeling as though you do not have energy, and not being able to tolerate cold.  This condition is treated with medicine to replace the thyroid hormones that your body does not make. This information  is not intended to replace advice given to you by your health care provider. Make sure you discuss any questions you have with your health care provider. Document Revised: 04/07/2017 Document Reviewed: 04/05/2017 Elsevier Patient Education  2020 [employer]     COVID-19: Quarantine vs. Isolation QUARANTINE keeps someone who was in close contact with someone who has COVID-19 away from others. If you had close contact with a person who has COVID-19  Stay home until 14 days after your last contact.   (THIS HAS BEEN SHORTENED TO 7 DAYS IN SOME AREAS WITH NO SYMPTOMS AND WITH NEGATIVE TESTING)  Check your temperature twice a day and watch for symptoms of COVID-19.  If possible, stay away from people who are at higher-risk for getting very sick from COVID-19. ISOLATION keeps someone who is sick or tested positive for COVID-19 without symptoms away from others, even in their own home. If you are sick and think or know you have COVID-19  Stay home until after ? At least 10 days since symptoms first appeared and ? At least 24 hours with no fever without fever-reducing medication and ? Symptoms have improved If you tested positive for COVID-19 but do not have symptoms  Stay home until after ? 10 days have passed since your positive test If you live with others, stay in a specific "sick room" or area and away from other people or animals, including pets. Use a separate bathroom, if available. [email or web address] 11/26/2018 This information is not intended to replace advice given to you by your health care provider. Make sure you discuss any questions you have with your health care provider. Document Revised: 04/11/2019 Document Reviewed: 04/11/2019 Elsevier Patient Education  2020 [employer].

## 2020-04-14 NOTE — Progress Notes (Signed)
Name: Mercedes Koch   MRN: [id number]    DOB: 1991-05-05   Date:04/14/2020       Progress Note  Subjective:    Chief Complaint  Chief Complaint  Patient presents with  . Hypothyroidism    I connected with  Mercedes Koch  on 04/14/20 at 10:00 AM EST by a video enabled telemedicine application and verified that I am speaking with the correct person using two identifiers.  I discussed the limitations of evaluation and management by telemedicine and the availability of in person appointments. The patient expressed understanding and agreed to proceed. Staff also discussed with the patient that there may be a patient responsible charge related to this service. Patient Location: (driving in car - called back 5 min later (at 10:15) and pt was then at home Provider Location: [facility] Additional Individuals present: none  HPI Pt presents for hypothyroid f/up Pt was not taking meds at the time of last appt - March 03, 2020 - about 6 weeks ago Was encouraged to restart meds, starting with 50 mcg daily and then increase to 100 mcg Prior to last OV slight dose increase from 100 to 125 mcg daily - pt did not like how she felt.  Dose was increased due to high TSH  Lab Results  Component Value Date   TSH 5.36 (H) 09/09/2019    Plan as of last OV: Monitor your symptoms or side effects.  Get your TSH rechecked after 6 weeks back on meds and then we will need to do a quick follow up to discuss the lab result and any symptoms back on the meds.  Call [facility] to find out cash price for the TSH lab You can do labs here if you want - just let me know and I'll order [facility] TSH here prior to your follow up appointment  Labs were ordered, but not yet done or resulted. Pt has been taking 100 mcg dose She feels a little better Current Symptoms:  denies fatigue, weight changes, heat/cold intolerance, bowel/skin changes or CVS symptoms  we will be repeating labs today. Still feels like hair is  still thinning, skin dry. Weight down a little  Lab Results  Component Value Date   TSH 5.36 (H) 09/09/2019   Depression screen [facility] 2/9 04/14/2020 03/03/2020 09/09/2019  Decreased Interest 0 3 0  Down, Depressed, Hopeless 0 0 0  PHQ - 2 Score 0 3 0  Altered sleeping - 3 0  Tired, decreased energy - 3 1  Change in appetite - 3 0  Feeling bad or failure about yourself  - 0 0  Trouble concentrating - 1 0  Moving slowly or fidgety/restless - 0 0  Suicidal thoughts - 0 0  PHQ-9 Score - 13 1  Difficult doing work/chores - Somewhat difficult Not difficult at all   phq reviewed  Exposure to covid with coworker/employer  Close contact to symptomatic employee last week tested positive on Friday, pt is asx, did have to do a covid test per recommendation of her job - results were inconclusive so she went today for retesting and she is quarantining      Patient Active Problem List   Diagnosis Date Noted  . Prediabetes 03/19/2020  . Vitamin D deficiency 09/09/2019  . Hepatic steatosis 09/09/2019  . Migraine with visual aura 08/17/2019  . Seizures (HCC) 08/17/2019  . SBO (small bowel obstruction) (HCC) 08/10/2019  . Cystitis 08/10/2019  . UTI (urinary tract infection) 08/10/2019  .  Epilepsy (HCC)   . Generalized abdominal pain   . History of abdominal surgery   . Hypothyroidism   . History of anemia 06/10/2019  . Hypertension 06/10/2019  . Metabolic syndrome 06/10/2019  . PCOS (polycystic ovarian syndrome) 06/10/2019  . Secondary oligomenorrhea 06/10/2019  . Class 3 severe obesity with body mass index (BMI) of 45.0 to 49.9 in adult [facility]) 06/10/2019    Social History   Tobacco Use  . Smoking status: Never Smoker  . Smokeless tobacco: Never Used  Substance Use Topics  . Alcohol use: No     Current Outpatient Medications:  .  levothyroxine (SYNTHROID) 100 MCG tablet, Take 1 tablet (100 mcg total) by mouth daily., Disp: 90 tablet, Rfl: 3  No Known Allergies  I personally  reviewed active problem list, medication list, allergies, family history, social history, health maintenance, notes from last encounter, lab results, imaging with the patient/caregiver today.   Review of Systems  10 Systems reviewed and are negative for acute change except as noted in the HPI.   Objective:   Virtual encounter, vitals limited, only able to obtain the following There were no vitals filed for this visit. There is no height or weight on file to calculate BMI. Nursing Note and Vital Signs reviewed.  Physical Exam Vitals and nursing note reviewed.  Constitutional:      General: She is not in acute distress.    Appearance: She is obese. She is not toxic-appearing or diaphoretic.  HENT:     Head: Normocephalic and atraumatic.     Nose: Nose normal.  Pulmonary:     Effort: No respiratory distress.  Neurological:     Mental Status: She is alert. Mental status is at baseline.  Psychiatric:        Mood and Affect: Mood normal.        Behavior: Behavior normal.     PE limited by telephone encounter  No results found for this or any previous visit (from the past 72 hour(s)).  Assessment and Plan:     ICD-10-CM   1. Hypothyroidism, unspecified type  E03.9 TSH   labs not done, ordered again here, pt will do after quaratine period, then med adjustments if needed pending labs - small dose changes if needed  2. Close exposure to COVID-19 virus  Z20.822    discussed quarantine timing and testing, currently pt asx, not vaccinated     -COVID exposure/quarantine and testing reviewed with pt - AVS included info, currently managing with work - encouraged here to get f/up if sx or needs help from PCP   - I discussed the assessment and treatment plan with the patient. The patient was provided an opportunity to ask questions and all were answered. The patient agreed with the plan and demonstrated an understanding of the instructions.  I provided 20+ minutes of non-face-to-face  time during this encounter.  Danelle Berry, PA-C 04/14/20 10:30 AM

## 2020-10-29 ENCOUNTER — Other Ambulatory Visit: Payer: Self-pay | Admitting: Family Medicine

## 2020-10-29 DIAGNOSIS — R7989 Other specified abnormal findings of blood chemistry: Secondary | ICD-10-CM

## 2020-10-29 DIAGNOSIS — E039 Hypothyroidism, unspecified: Secondary | ICD-10-CM

## 2020-11-10 ENCOUNTER — Encounter: Payer: Self-pay | Admitting: Family Medicine

## 2020-11-24 ENCOUNTER — Ambulatory Visit: Payer: Self-pay | Admitting: Family Medicine

## 2020-12-01 ENCOUNTER — Ambulatory Visit: Payer: Self-pay | Admitting: Family Medicine

## 2020-12-11 ENCOUNTER — Telehealth: Payer: Self-pay | Admitting: Physician Assistant

## 2020-12-11 DIAGNOSIS — T2022XA Burn of second degree of lip(s), initial encounter: Secondary | ICD-10-CM

## 2020-12-11 NOTE — Patient Instructions (Signed)
Mercedes Koch, thank you for joining Margaretann Loveless, PA-C for today's virtual visit.  While this provider is not your primary care provider (PCP), if your PCP is located in our provider database this encounter information will be shared with them immediately following your visit. Consent: (Patient) Mercedes Koch provided verbal consent for this virtual visit at the beginning of the encounter. Current Medications:  Current Outpatient Medications:    EUTHYROX 100 MCG tablet, Take 1 tablet by mouth once daily, Disp: 30 tablet, Rfl: 0  Medications ordered in this encounter:  No orders of the defined types were placed in this encounter.  *If you need refills on other medications prior to your next appointment, please contact your pharmacy* Follow-Up: Call back or seek an in-person evaluation if the symptoms worsen or if the condition fails to improve as anticipated. Other Instructions - Advised conservative management with keeping lips moist (Vaseline or other thick lip lubricant would be best), apply ice for 5-10 minutes and repeat 4-5 times per day. Tylenol or ibuprofen for swelling and pain. Could use oragel topically for numbing lips, particularly while eating, or using Aloe vera with lidocaine applied topically to lips. - Suspect self resolving with time, advised could take over 2 weeks - Advised to monitor for signs of infection and call back if any develop If you have been instructed to have an in-person evaluation today at a local Urgent Care facility, please use the link below. It will take you to a list of all of our available [facility], including address, phone number and hours of operation. Please do not delay care.  [facility] If you or a family member do not have a primary care provider, use the link below to schedule a visit and establish care. When you choose a [facility] primary care physician or advanced practice provider, you gain a long-term  partner in health. Find a Primary Care Provider Learn more about [facility]'s in-office and virtual care options: [facility] - Get Care Now  [place] textbook of surgery (20th ed., pp. [phone]). [place], [place]: Elsevier."> Current surgical therapy (12th ed., pp. [phone]). [place]: Elsevier."> Ferri's Clinical Advisor 2018 (pp. [phone].e4). [place]: Elsevier."> Rosen's emergency medicine: Concepts and clinical practice (9th ed., pp. [phone].e2). [place]: Elsevier.">  Second-Degree Burn, Adult  A second-degree burn, also called a partial thickness wound, is a serious injury that affects the first two layers of skin (epidermis and dermis). A second-degree burn may be minor or major, depending on the size and partsof the skin that are burned. What are the causes? This condition may be caused by: Heat, such as from a flame or hot liquid. Radiation, such as from sunlight or radiation treatments. Electricity. This can happen when electricity passes through the body, such as from lightning, electrical outlets, or power lines. Certain chemicals, such as acids that come into contact with the skin or eyes. Some chemicals can go through clothing. What increases the risk? The following factors may make you more likely to develop this condition: Being exposed to high-risk environments, such as those with open flames, chemicals, or electricity. Having cancer and being treated with radiation. What are the signs or symptoms? Symptoms of this condition include: Severe pain. Changes in the skin. The skin can be deep red, blistered, tender, swollen, blotchy, or shiny. How is this diagnosed? This condition is usually diagnosed with a physical exam. Your health careprovider may remove any blistered skin during the exam.  It may take several days to diagnose the condition because this kind of burn can take time to develop. Watch the wound for changes at home. Visit a health care  provider often to have your wound checked. If the wound is large, you maystay in the hospital so a health care team can examine the wound for a few days. How is this treated? Treatment depends on the severity and cause of the burn. Some second-degree burns, including major burns, electrical burns, and chemical burns, may need to be treated in a hospital. Treatment may include: Cooling the burn with cool, germ-free (sterile) water. Taking or applying medicines, such as: Medicines to relieve pain or itching. Ointments to treat or prevent infection. Antibiotic medicine to treat or prevent infection. Getting a tetanus shot. Covering the burn with a bandage (dressing). Applying pressure dressings to prevent scarring and to keep mobility in the burned part of the body. Removing dead skin. This is done by a health care provider. Do not try to remove dead skin yourself. In deep and large wounds, treatment involves: Surgery to remove scabs. Being given fluids and nutrition. Close monitoring of blood flow near the wound. Oxygen given through a mask or a machine (ventilator). Follow these instructions at home: Medicines Take and apply over-the-counter and prescription medicines and ointments only as told by your health care provider. If you were prescribed an antibiotic medicine, take or apply it as told by your health care provider. Do not stop using the antibiotic even if you start to feel better. Eating and drinking  Drink enough fluid to keep your urine pale yellow. Eat a nutritious diet that is high in protein. This will help your wound heal.  Wound care  Follow instructions from your health care provider about how to take care of your wound. Make sure you: Wash your hands with soap and water for at least 20 seconds before and after you change your dressing. If soap and water are not available, use hand sanitizer. Change your dressing as told by your health care provider. If you have a  compression dressing, wear it as told by your health care provider. Clean your wound 2 times a day, or as often as told by your health care provider. Wash the wound with mild soap and water. Rinse the wound with water to remove all soap. Pat the wound dry with a clean towel. Do not rub. Check your wound every day for signs of infection. Check for: More redness, swelling, or pain. Fluid or blood. Warmth. Pus or a bad smell. Do not scratch or pick at the wound. Do not break any blisters or peel any skin. Avoid exposing your wound to the sun.  General instructions If possible, raise (elevate) the injured area above the level of your heart while you are sitting or lying down. Rest as told by your health care provider. Do not exercise until your health care provider approves. Do not take baths, swim, use a hot tub, or do anything that would put your burn underwater until your health care provider approves. Ask your health care provider if you may take showers. You may only be allowed to take sponge baths. Do not put ice on your burn. This can cause more damage. Try cooling the burn with: Cool water. A cold, wet cloth (cold compress). Do range-of-motion movements if told by your health care provider. Do not use any products that contain nicotine or tobacco, such as cigarettes, e-cigarettes, and chewing tobacco. These can  delay healing. If you need help quitting, ask your health care provider. Keep all follow-up visits as told by your health care provider. This is important. How is this prevented? Make sure your water heaters are set to 120F (49C) or lower. Make sure you know how to get out of your home in case of a fire. Install smoke alarms in your home. Check them regularly to make sure they are working. Contact a health care provider if: Your symptoms do not improve with treatment. Your pain is not relieved with medicine. You have more redness, swelling, or pain around your wound. You  have fluid, blood, pus, or a bad smell coming from the wound. Your wound feels warm to the touch. You have a fever or chills. Get help right away if: You develop red streaks near the wound. You develop severe pain. Summary A second-degree burn is a serious injury that affects the first two layers of skin. Clean your wound 2 times a day or as often as told. Check your wound every day for signs of infection. Do not scratch or pick at your wound, break blisters, peel skin, or put ice on your burn. This information is not intended to replace advice given to you by your health care provider. Make sure you discuss any questions you have with your healthcare provider. Document Revised: 02/27/2019 Document Reviewed: 02/27/2019 Elsevier Patient Education  2022 [employer].

## 2020-12-11 NOTE — Progress Notes (Signed)
Virtual Visit Consent   Mercedes Koch, you are scheduled for a virtual visit with a [facility] provider today.     Just as with appointments in the office, your consent must be obtained to participate.  Your consent will be active for this visit and any virtual visit you may have with one of our providers in the next 365 days.     If you have a MyChart account, a copy of this consent can be sent to you electronically.  All virtual visits are billed to your insurance company just like a traditional visit in the office.    As this is a virtual visit, video technology does not allow for your provider to perform a traditional examination.  This may limit your provider's ability to fully assess your condition.  If your provider identifies any concerns that need to be evaluated in person or the need to arrange testing (such as labs, EKG, etc.), we will make arrangements to do so.     Although advances in technology are sophisticated, we cannot ensure that it will always work on either your end or our end.  If the connection with a video visit is poor, the visit may have to be switched to a telephone visit.  With either a video or telephone visit, we are not always able to ensure that we have a secure connection.     I need to obtain your verbal consent now.   Are you willing to proceed with your visit today?    Mercedes Koch has provided verbal consent on 12/11/2020 for a virtual visit (video or telephone).   Margaretann Loveless, PA-C   Date: 12/11/2020 10:12 AM   Virtual Visit via Video Note   I, Margaretann Loveless, connected with  Mercedes Koch  ([id number], [birth year]) on 12/11/20 at 10:00 AM EDT by a video-enabled telemedicine application and verified that I am speaking with the correct person using two identifiers.  Location: Patient: Virtual Visit Location Patient: Home Provider: Virtual Visit Location Provider: Home Office   I discussed the limitations of evaluation and  management by telemedicine and the availability of in person appointments. The patient expressed understanding and agreed to proceed.    History of Present Illness: Mercedes Koch is a 30 y.o. who identifies as a female who was assigned female at birth, and is being seen today for blisters on her lips. She reports on Monday, 12/07/20, she was out in the sun with her kids. She was focused on making sure they had sunscreen, and thought she had put sunscreen on but must have forgotten. She reports she noticed some discomfort on Tuesday, but her lips have become more painful and yesterday and today has developed blisters. She has not put anything on them. Denies any cracking, crusting, or discharge. Does not have any history of cold sores.    Problems:  Patient Active Problem List   Diagnosis Date Noted   Prediabetes 03/19/2020   Vitamin D deficiency 09/09/2019   Hepatic steatosis 09/09/2019   Migraine with visual aura 08/17/2019   Seizures (HCC) 08/17/2019   SBO (small bowel obstruction) (HCC) 08/10/2019   Cystitis 08/10/2019   UTI (urinary tract infection) 08/10/2019   Epilepsy (HCC)    Generalized abdominal pain    History of abdominal surgery    Hypothyroidism    History of anemia 06/10/2019   Hypertension 06/10/2019   Metabolic syndrome 06/10/2019   PCOS (polycystic ovarian syndrome) 06/10/2019   Secondary oligomenorrhea  06/10/2019   Class 3 severe obesity with body mass index (BMI) of 45.0 to 49.9 in adult [facility]) 06/10/2019    Allergies: No Known Allergies Medications:  Current Outpatient Medications:    EUTHYROX 100 MCG tablet, Take 1 tablet by mouth once daily, Disp: 30 tablet, Rfl: 0  Observations/Objective: Patient is well-developed, well-nourished in no acute distress.  Resting comfortably at home.  Head is normocephalic, atraumatic.  No labored breathing.  Speech is clear and coherent with logical content.  Patient is alert and oriented at baseline.    Assessment and  Plan: 1. Partial thickness burn of lip, initial encounter - No signs of infection currently - Advised conservative management with keeping lips moist (Vaseline or other thick lip lubricant would be best), apply ice for 5-10 minutes and repeat 4-5 times per day. Tylenol or ibuprofen for swelling and pain. Could use oragel topically for numbing lips, particularly while eating, or using Aloe vera with lidocaine applied topically to lips. - Suspect self resolving with time, advised could take over 2 weeks - Advised to monitor for signs of infection and call back if any develop  Follow Up Instructions: I discussed the assessment and treatment plan with the patient. The patient was provided an opportunity to ask questions and all were answered. The patient agreed with the plan and demonstrated an understanding of the instructions.  A copy of instructions were sent to the patient via MyChart.  The patient was advised to call back or seek an in-person evaluation if the symptoms worsen or if the condition fails to improve as anticipated.  Time:  I spent 8 minutes with the patient via telehealth technology discussing the above problems/concerns.    Margaretann Loveless, PA-C

## 2021-01-14 ENCOUNTER — Ambulatory Visit: Admission: EM | Admit: 2021-01-14 | Discharge: 2021-01-14 | Discharge status: Absent without leave | Payer: Self-pay

## 2021-01-14 ENCOUNTER — Emergency Department (HOSPITAL_COMMUNITY)
Admission: EM | Admit: 2021-01-14 | Discharge: 2021-01-14 | Disposition: A | Discharge status: Home / Self Care | Payer: Self-pay | Attending: Emergency Medicine | Admitting: Emergency Medicine

## 2021-01-14 ENCOUNTER — Encounter (HOSPITAL_COMMUNITY): Payer: Self-pay

## 2021-01-14 ENCOUNTER — Other Ambulatory Visit: Payer: Self-pay

## 2021-01-14 ENCOUNTER — Emergency Department (HOSPITAL_COMMUNITY): Payer: Self-pay

## 2021-01-14 DIAGNOSIS — R079 Chest pain, unspecified: Secondary | ICD-10-CM

## 2021-01-14 DIAGNOSIS — I10 Essential (primary) hypertension: Secondary | ICD-10-CM | POA: Insufficient documentation

## 2021-01-14 DIAGNOSIS — E039 Hypothyroidism, unspecified: Secondary | ICD-10-CM | POA: Insufficient documentation

## 2021-01-14 DIAGNOSIS — N9489 Other specified conditions associated with female genital organs and menstrual cycle: Secondary | ICD-10-CM | POA: Insufficient documentation

## 2021-01-14 DIAGNOSIS — R202 Paresthesia of skin: Secondary | ICD-10-CM | POA: Insufficient documentation

## 2021-01-14 DIAGNOSIS — Z79899 Other long term (current) drug therapy: Secondary | ICD-10-CM | POA: Insufficient documentation

## 2021-01-14 DIAGNOSIS — R072 Precordial pain: Secondary | ICD-10-CM | POA: Insufficient documentation

## 2021-01-14 DIAGNOSIS — R7303 Prediabetes: Secondary | ICD-10-CM | POA: Insufficient documentation

## 2021-01-14 DIAGNOSIS — R0602 Shortness of breath: Secondary | ICD-10-CM | POA: Insufficient documentation

## 2021-01-14 DIAGNOSIS — Z7984 Long term (current) use of oral hypoglycemic drugs: Secondary | ICD-10-CM | POA: Insufficient documentation

## 2021-01-14 LAB — BASIC METABOLIC PANEL
Anion gap: 6 (ref 5–15)
BUN: 16 mg/dL (ref 6–20)
CO2: 29 mmol/L (ref 22–32)
Calcium: 9.7 mg/dL (ref 8.9–10.3)
Chloride: 102 mmol/L (ref 98–111)
Creatinine, Ser: 0.73 mg/dL (ref 0.44–1.00)
GFR, Estimated: >60 mL/min (ref >60)
Glucose, Bld: 87 mg/dL (ref 70–99)
Potassium: 3.8 mmol/L (ref 3.5–5.1)
Sodium: 137 mmol/L (ref 135–145)

## 2021-01-14 LAB — I-STAT BETA HCG BLOOD, ED (MC, WL, AP ONLY): I-stat hCG, quantitative: <5 m[IU]/mL (ref <5)

## 2021-01-14 LAB — TROPONIN I (HIGH SENSITIVITY)
Troponin I (High Sensitivity): <2 ng/L (ref <18)
Troponin I (High Sensitivity): <2 ng/L (ref <18)

## 2021-01-14 LAB — CBC
HCT: 40.7 % (ref 36.0–46.0)
Hemoglobin: 12.8 g/dL (ref 12.0–15.0)
MCH: 27.5 pg (ref 26.0–34.0)
MCHC: 31.4 g/dL (ref 30.0–36.0)
MCV: 87.5 fL (ref 80.0–100.0)
Platelets: 295 10*3/uL (ref 150–400)
RBC: 4.65 MIL/uL (ref 3.87–5.11)
RDW: 12.8 % (ref 11.5–15.5)
WBC: 10.5 10*3/uL (ref 4.0–10.5)
nRBC: 0 % (ref 0.0–0.2)

## 2021-01-14 NOTE — ED Provider Notes (Signed)
[facility] EMERGENCY DEPARTMENT Provider Note   CSN: [id number] Arrival date & time: 01/14/21  1952     History Chief Complaint  Patient presents with   Chest Pain    Mercedes Koch is a 30 y.o. female.  She is here with 2 complaints.  She has had on and off numbness in her hands bilaterally for a few weeks.  Today while at work she experienced some chest pressure and felt a little short of breath.  It is eased up since then but she still feels a little bit of pressure.  She denies prior history of chest pain.  No prior respiratory history.  No cough.  No fevers or chills.  No weakness.  Non-smoker  The history is provided by the patient.  Chest Pain Pain location:  Substernal area Pain quality: pressure   Pain radiates to:  Does not radiate Pain severity:  Moderate Onset quality:  Gradual Timing:  Sporadic Progression:  Improving Chronicity:  New Relieved by:  None tried Worsened by:  Nothing Ineffective treatments:  None tried Associated symptoms: numbness and shortness of breath   Associated symptoms: no abdominal pain, no back pain, no cough, no diaphoresis, no fever, no nausea, no vomiting and no weakness       Past Medical History:  Diagnosis Date   Anemia    Epilepsy (HCC)    Hypertension    PCOS (polycystic ovarian syndrome)     Patient Active Problem List   Diagnosis Date Noted   Prediabetes 03/19/2020   Vitamin D deficiency 09/09/2019   Hepatic steatosis 09/09/2019   Migraine with visual aura 08/17/2019   Seizures (HCC) 08/17/2019   SBO (small bowel obstruction) (HCC) 08/10/2019   Cystitis 08/10/2019   UTI (urinary tract infection) 08/10/2019   Epilepsy (HCC)    Generalized abdominal pain    History of abdominal surgery    Hypothyroidism    History of anemia 06/10/2019   Hypertension 06/10/2019   Metabolic syndrome 06/10/2019   PCOS (polycystic ovarian syndrome) 06/10/2019   Secondary oligomenorrhea 06/10/2019   Class 3 severe obesity with body  mass index (BMI) of 45.0 to 49.9 in adult (HCC) 06/10/2019    Past Surgical History:  Procedure Laterality Date   CESAREAN SECTION  2018     OB History     Gravida  1   Para      Term      Preterm      AB      Living         SAB      IAB      Ectopic      Multiple      Live Births              Family History  Problem Relation Age of Onset   Hypertension Mother    Hyperlipidemia Mother    Osteoporosis Mother    Fibromyalgia Mother    Breast cancer Mother    Heart attack Mother 83   Hypertension Father    Hyperlipidemia Father    Breast cancer Maternal Grandmother    Heart failure Maternal Grandmother    Hyperlipidemia Maternal Grandmother    Hypertension Maternal Grandmother    Pneumonia Maternal Grandfather    Dementia Maternal Grandfather    Alzheimer's disease Maternal Grandfather    Hypertension Maternal Grandfather    Breast cancer Paternal Grandmother    Stroke Paternal Grandmother    Kidney failure Paternal Grandfather    Prostate  cancer Paternal Grandfather     Social History   Tobacco Use   Smoking status: Never   Smokeless tobacco: Never  Vaping Use   Vaping Use: Never used  Substance Use Topics   Alcohol use: No   Drug use: No    Home Medications Prior to Admission medications   Medication Sig Start Date End Date Taking? Authorizing Provider  levothyroxine (SYNTHROID) 100 MCG tablet Take by mouth. 07/02/19  Yes [provider]  medroxyPROGESTERone (PROVERA) 10 MG tablet Take 1 tablet by mouth 3 (three) times daily. 11/10/20  Yes [provider]  clomiPHENE (CLOMID) 50 MG tablet Take by mouth. 07/21/20   [provider]  EUTHYROX 100 MCG tablet Take 1 tablet by mouth once daily 11/11/20   Danelle Berry, PA-C  metFORMIN (GLUCOPHAGE-XR) 500 MG 24 hr tablet Take 500 mg by mouth daily. 11/06/20   [provider]    Allergies    Patient has no known allergies.  Review of Systems   Review of Systems   Constitutional:  Negative for diaphoresis and fever.  HENT:  Negative for sore throat.   Eyes:  Negative for visual disturbance.  Respiratory:  Positive for shortness of breath. Negative for cough.   Cardiovascular:  Positive for chest pain.  Gastrointestinal:  Negative for abdominal pain, nausea and vomiting.  Genitourinary:  Negative for dysuria.  Musculoskeletal:  Negative for back pain.  Skin:  Negative for rash.  Neurological:  Positive for numbness. Negative for weakness.   Physical Exam Updated Vital Signs BP (!) 143/92   Pulse 88   Temp 98 F (36.7 C)   Resp 20   Ht 5\' 5"  (1.651 m)   Wt 122.5 kg   LMP  (LMP Unknown)   SpO2 97%   BMI 44.93 kg/m   Physical Exam Vitals and nursing note reviewed.  Constitutional:      General: She is not in acute distress.    Appearance: Normal appearance. She is well-developed.  HENT:     Head: Normocephalic and atraumatic.  Eyes:     Conjunctiva/sclera: Conjunctivae normal.  Cardiovascular:     Rate and Rhythm: Normal rate and regular rhythm.     Heart sounds: No murmur heard. Pulmonary:     Effort: Pulmonary effort is normal. No respiratory distress.     Breath sounds: Normal breath sounds.  Abdominal:     Palpations: Abdomen is soft.     Tenderness: There is no abdominal tenderness.  Musculoskeletal:        General: No deformity or signs of injury. Normal range of motion.     Cervical back: Neck supple.  Skin:    General: Skin is warm and dry.  Neurological:     General: No focal deficit present.     Mental Status: She is alert and oriented to person, place, and time.     Cranial Nerves: No cranial nerve deficit.     Sensory: No sensory deficit.     Motor: No weakness.     Gait: Gait normal.    ED Results / Procedures / Treatments   Labs (all labs ordered are listed, but only abnormal results are displayed) Labs Reviewed  BASIC METABOLIC PANEL  CBC  I-STAT BETA HCG BLOOD, ED ([facility], [facility], [facility] ONLY)  TROPONIN I (HIGH  SENSITIVITY)  TROPONIN I (HIGH SENSITIVITY)    EKG EKG Interpretation  Date/Time:  Thursday January 14 2021 20:00:41 EDT Ventricular Rate:  94 PR Interval:  169 QRS Duration:  76 QT Interval:  346 QTC Calculation: 433 R Axis:   73 Text Interpretation: Sinus rhythm No significant change since prior 6/14 Confirmed by Meridee Score [phone]) on 01/14/2021 8:17:26 PM  Radiology DG Chest 2 View  Result Date: 01/14/2021 CLINICAL DATA:  Chest pain. EXAM: CHEST - 2 VIEW COMPARISON:  August 10, 2019. FINDINGS: The heart size and mediastinal contours are within normal limits. Both lungs are clear. The visualized skeletal structures are unremarkable. IMPRESSION: No active cardiopulmonary disease. Electronically Signed   By: Lupita Raider M.D.   On: 01/14/2021 21:46    Procedures Procedures   Medications Ordered in ED Medications - No data to display  ED Course  I have reviewed the triage vital signs and the nursing notes.  Pertinent labs & imaging results that were available during my care of the patient were reviewed by me and considered in my medical decision making (see chart for details).  Clinical Course as of 01/15/21 1740  Thu Jan 14, 2021  2253 Reviewed results with patient.  She is comfortable plan.  Return instructions discussed [MB]    Clinical Course User Index [MB] Terrilee Files, MD   MDM Rules/Calculators/A&P                          This patient complains of chest pressure and bilateral hand numbness; this involves an extensive number of treatment Options and is a complaint that carries with it a high risk of complications and Morbidity. The differential includes ACS, pneumonia, pneumothorax, PE, reflux, musculoskeletal, paresthesias, electrolyte derangement, carpal tunnel  I ordered, reviewed and interpreted labs, which included CBC with normal white count normal hemoglobin, chemistries normal, troponins flat, pregnancy test negative I ordered imaging studies  which included chest x-ray and I independently    visualized and interpreted imaging which showed no acute findings Previous records obtained and reviewed in epic no recent admissions  After the interventions stated above, I reevaluated the patient and found patient to be symptom-free at this time.  Work-up does not indicate any acute process requiring further work-up at this time.  Recommended follow-up with primary care doctor.  Return instructions discussed   Final Clinical Impression(s) / ED Diagnoses Final diagnoses:  Nonspecific chest pain  Paresthesia of both hands    Rx / DC Orders ED Discharge Orders     None        Terrilee Files, MD 01/15/21 [id number]

## 2021-01-14 NOTE — Discharge Instructions (Signed)
You are seen in the emergency department for some chest pressure and shortness of breath and also some tingling in your hands.  You had blood work EKG and a chest x-ray that did not show an obvious explanation for your symptoms.  You should follow-up with your primary care doctor.  Return to the emergency department if any worsening or concerning symptoms

## 2021-01-14 NOTE — ED Triage Notes (Signed)
Pt. States they began feeling chest pain earlier today and it eased up but has return. Pt. States their arms and fingers have been going numb for about two weeks now.

## 2021-02-01 ENCOUNTER — Ambulatory Visit: Payer: Self-pay | Admitting: Family Medicine

## 2021-02-01 ENCOUNTER — Other Ambulatory Visit: Payer: Self-pay

## 2021-02-01 ENCOUNTER — Encounter: Payer: Self-pay | Admitting: Family Medicine

## 2021-02-01 VITALS — BP 118/84 | HR 79 | Temp 98.2°F | Resp 16 | Ht 66.0 in | Wt 274.9 lb

## 2021-02-01 DIAGNOSIS — R519 Headache, unspecified: Secondary | ICD-10-CM

## 2021-02-01 DIAGNOSIS — H538 Other visual disturbances: Secondary | ICD-10-CM

## 2021-02-01 DIAGNOSIS — R569 Unspecified convulsions: Secondary | ICD-10-CM

## 2021-02-01 DIAGNOSIS — E559 Vitamin D deficiency, unspecified: Secondary | ICD-10-CM

## 2021-02-01 DIAGNOSIS — G40909 Epilepsy, unspecified, not intractable, without status epilepticus: Secondary | ICD-10-CM

## 2021-02-01 DIAGNOSIS — E039 Hypothyroidism, unspecified: Secondary | ICD-10-CM

## 2021-02-01 DIAGNOSIS — Z6841 Body Mass Index (BMI) 40.0 and over, adult: Secondary | ICD-10-CM

## 2021-02-01 DIAGNOSIS — G629 Polyneuropathy, unspecified: Secondary | ICD-10-CM

## 2021-02-01 DIAGNOSIS — E282 Polycystic ovarian syndrome: Secondary | ICD-10-CM

## 2021-02-01 DIAGNOSIS — R7303 Prediabetes: Secondary | ICD-10-CM

## 2021-02-01 DIAGNOSIS — E8881 Metabolic syndrome: Secondary | ICD-10-CM

## 2021-02-01 DIAGNOSIS — I10 Essential (primary) hypertension: Secondary | ICD-10-CM

## 2021-02-01 NOTE — Progress Notes (Signed)
Patient ID: Mercedes Koch, female    DOB: 1990-08-05, 30 y.o.   MRN: [id number]  PCP: Mercedes Berry, PA-C  Chief Complaint  Patient presents with   Hypothyroidism   Blurred Vision    Comes and goes giving her headaches   Numbness    Tingling in feet and hands    Subjective:   Mercedes Koch is a 30 y.o. female, presents to clinic with CC of the following:  HPI  Presents with multiple concerns and f/up on hypothyroid - last labs were over a year ago, never did f/up or repeat labs  Hypothyroidism: Current Medication Regimen: 100 mcg euthyrox  Current Symptoms: fatigue, swelling, feeling slow, and losing hair  Also Numbness and tingling in extremities?  Weight fairly stable Wt Readings from Last 5 Encounters:  02/01/21 274 lb 14.4 oz (124.7 kg)  01/14/21 270 lb (122.5 kg)  03/03/20 280 lb 4.8 oz (127.1 kg)  09/09/19 284 lb 3.2 oz (128.9 kg)  08/10/19 298 lb 4.5 oz (135.3 kg)   BMI Readings from Last 5 Encounters:  02/01/21 44.37 kg/m  01/14/21 44.93 kg/m  03/03/20 46.64 kg/m  09/09/19 47.29 kg/m  08/10/19 49.64 kg/m    Most recent results are below; we will be repeating labs today. Lab Results  Component Value Date   TSH 5.36 (H) 09/09/2019   Blurry vision - hx of migraines with visual aura   Last vitamin D Lab Results  Component Value Date   VD25OH 10 (L) 06/10/2019  Not on any supplements    Depression screen [facility] 2/9 02/01/2021 04/14/2020 03/03/2020  Decreased Interest 0 0 3  Down, Depressed, Hopeless 0 0 0  PHQ - 2 Score 0 0 3  Altered sleeping 0 - 3  Tired, decreased energy 0 - 3  Change in appetite 0 - 3  Feeling bad or failure about yourself  0 - 0  Trouble concentrating 0 - 1  Moving slowly or fidgety/restless 0 - 0  Suicidal thoughts 0 - 0  PHQ-9 Score 0 - 13  Difficult doing work/chores Not difficult at all - Somewhat difficult       Patient Active Problem List   Diagnosis Date Noted   Prediabetes 03/19/2020   Vitamin D  deficiency 09/09/2019   Hepatic steatosis 09/09/2019   Migraine with visual aura 08/17/2019   Seizures (HCC) 08/17/2019   SBO (small bowel obstruction) (HCC) 08/10/2019   Cystitis 08/10/2019   UTI (urinary tract infection) 08/10/2019   Epilepsy (HCC)    Generalized abdominal pain    History of abdominal surgery    Hypothyroidism    History of anemia 06/10/2019   Hypertension 06/10/2019   Metabolic syndrome 06/10/2019   PCOS (polycystic ovarian syndrome) 06/10/2019   Secondary oligomenorrhea 06/10/2019   Class 3 severe obesity with body mass index (BMI) of 45.0 to 49.9 in adult (HCC) 06/10/2019      Current Outpatient Medications:    EUTHYROX 100 MCG tablet, Take 1 tablet by mouth once daily, Disp: 30 tablet, Rfl: 0   levothyroxine (SYNTHROID) 100 MCG tablet, Take 100 mcg by mouth daily before breakfast., Disp: , Rfl:    metFORMIN (GLUCOPHAGE-XR) 500 MG 24 hr tablet, Take 500 mg by mouth daily., Disp: , Rfl:    clomiPHENE (CLOMID) 50 MG tablet, Take by mouth. (Patient not taking: No sig reported), Disp: , Rfl:    medroxyPROGESTERone (PROVERA) 10 MG tablet, Take 1 tablet by mouth 3 (three) times daily. (Patient not taking: No sig  reported), Disp: , Rfl:    No Known Allergies   Social History   Tobacco Use   Smoking status: Never   Smokeless tobacco: Never  Vaping Use   Vaping Use: Never used  Substance Use Topics   Alcohol use: No   Drug use: No      Chart Review Today: I personally reviewed active problem list, medication list, allergies, family history, social history, health maintenance, notes from last encounter, lab results, imaging with the patient/caregiver today.   Review of Systems 10 Systems reviewed and are negative for acute change except as noted in the HPI.     Objective:   Vitals:   02/01/21 1105  BP: 118/84  Pulse: 79  Resp: 16  Temp: 98.2 F (36.8 C)  SpO2: 97%  Weight: 274 lb 14.4 oz (124.7 kg)  Height: 5\' 6"  (1.676 m)    Body mass  index is 44.37 kg/m.  Physical Exam Vitals and nursing note reviewed.  Constitutional:      General: She is not in acute distress.    Appearance: Normal appearance. She is well-developed. She is not ill-appearing, toxic-appearing or diaphoretic.     Interventions: Face mask in place.  HENT:     Head: Normocephalic and atraumatic.     Right Ear: External ear normal.     Left Ear: External ear normal.  Eyes:     General: Lids are normal. No scleral icterus.       Right eye: No discharge.        Left eye: No discharge.     Extraocular Movements: Extraocular movements intact.     Conjunctiva/sclera: Conjunctivae normal.  Neck:     Trachea: Phonation normal. No tracheal deviation.  Cardiovascular:     Rate and Rhythm: Normal rate and regular rhythm.     Pulses: Normal pulses.          Radial pulses are 2+ on the right side and 2+ on the left side.       Posterior tibial pulses are 2+ on the right side and 2+ on the left side.     Heart sounds: Normal heart sounds. No murmur heard.   No friction rub. No gallop.  Pulmonary:     Effort: Pulmonary effort is normal. No respiratory distress.     Breath sounds: Normal breath sounds. No stridor. No wheezing, rhonchi or rales.  Chest:     Chest wall: No tenderness.  Abdominal:     General: Bowel sounds are normal. There is no distension.     Palpations: Abdomen is soft.  Musculoskeletal:        General: No swelling.     Right lower leg: No edema.     Left lower leg: No edema.  Skin:    General: Skin is warm and dry.     Capillary Refill: Capillary refill takes less than 2 seconds.     Coloration: Skin is not jaundiced or pale.     Findings: No lesion or rash.  Neurological:     Mental Status: She is alert.     Cranial Nerves: No cranial nerve deficit, dysarthria or facial asymmetry.     Sensory: Sensation is intact.     Motor: Motor function is intact. No weakness, tremor, abnormal muscle tone or seizure activity.      Coordination: Coordination is intact.     Gait: Gait is intact. Gait normal.  Psychiatric:        Mood and Affect: Mood  normal.        Speech: Speech normal.        Behavior: Behavior normal.     Results for orders placed or performed during the hospital encounter of 01/14/21  Basic metabolic panel  Result Value Ref Range   Sodium 137 135 - 145 mmol/L   Potassium 3.8 3.5 - 5.1 mmol/L   Chloride 102 98 - 111 mmol/L   CO2 29 22 - 32 mmol/L   Glucose, Bld 87 70 - 99 mg/dL   BUN 16 6 - 20 mg/dL   Creatinine, Ser 9.32 0.44 - 1.00 mg/dL   Calcium 9.7 8.9 - 35.5 mg/dL   GFR, Estimated >73 >22 mL/min   Anion gap 6 5 - 15  CBC  Result Value Ref Range   WBC 10.5 4.0 - 10.5 K/uL   RBC 4.65 3.87 - 5.11 MIL/uL   Hemoglobin 12.8 12.0 - 15.0 g/dL   HCT 02.5 42.7 - 06.2 %   MCV 87.5 80.0 - 100.0 fL   MCH 27.5 26.0 - 34.0 pg   MCHC 31.4 30.0 - 36.0 g/dL   RDW 37.6 28.3 - 15.1 %   Platelets 295 150 - 400 K/uL   nRBC 0.0 0.0 - 0.2 %  I-Stat Beta hCG blood, ED ([facility], [facility], [facility] only)  Result Value Ref Range   I-stat hCG, quantitative <5.0 <5 mIU/mL   Comment 3          Troponin I (High Sensitivity)  Result Value Ref Range   Troponin I (High Sensitivity) <2 <18 ng/L  Troponin I (High Sensitivity)  Result Value Ref Range   Troponin I (High Sensitivity) <2 <18 ng/L       Assessment & Plan:     ICD-10-CM   1. Hypothyroidism, unspecified type  E03.9 TSH    2. Nonintractable epilepsy without status epilepticus, unspecified epilepsy type [facility]) Chronic G40.909 Ambulatory referral to Neurosurgery    3. Class 3 severe obesity with serious comorbidity and body mass index (BMI) of 40.0 to 44.9 in adult, unspecified obesity type (HCC)  E66.01 TSH   Z68.41 Hemoglobin A1c    4. Nonintractable episodic headache, unspecified headache type  R51.9 Ambulatory referral to Neurosurgery    5. Blurry vision, bilateral  H53.8 Ambulatory referral to Neurosurgery    6. Seizures (HCC)  R56.9 Ambulatory  referral to Neurosurgery    7. Metabolic syndrome  E88.81     8. PCOS (polycystic ovarian syndrome)  E28.2     9. Prediabetes  R73.03 Hemoglobin A1c    10. Peripheral polyneuropathy  G62.9 TSH    Hemoglobin A1c    Ambulatory referral to Neurosurgery    CANCELED: Vitamin B12   extremities hands and feet    11. Vitamin D deficiency  E55.9 VITAMIN D 25 Hydroxy (Vit-D Deficiency, Fractures)    12. Hypertension, unspecified type  I10       Pt presents with chronic conditions which she has not f/up for and several new sx with pertinent past med hx of seizures in her youth, with similar sx in the past, no recent seizure activity, hx of migraines, experiencing HA's, blurred vision, intermittent tingling/paresthesias in her extremities w/o weakness, swelling, color change. Esp with neuro for eval  F/up on thyroid labs and adjust meds as needed, unclear if thyroid is related to her various sx Vit D deficiency - level previously very low and she is not on supplement - recheck Discussed labs/cost and options vit D TSH and A1C sent  to hospital lab to help with cost Will do f/up on other chronic conditions when she returns (ie pcos, prediabetes, obesity - following lab results - offered to do virtual f/up)     Mercedes Berry, PA-C 02/01/21 11:21 AM

## 2021-02-14 ENCOUNTER — Telehealth: Payer: Self-pay | Admitting: Family

## 2021-02-14 DIAGNOSIS — B9689 Other specified bacterial agents as the cause of diseases classified elsewhere: Secondary | ICD-10-CM

## 2021-02-14 DIAGNOSIS — J208 Acute bronchitis due to other specified organisms: Secondary | ICD-10-CM

## 2021-02-14 MED ORDER — BENZONATATE 100 MG PO CAPS: 100.0000 mg | ORAL_CAPSULE | Freq: Three times a day (TID) | ORAL | 0 refills | Status: DC | PRN

## 2021-02-14 MED ORDER — AZITHROMYCIN 250 MG PO TABS: ORAL_TABLET | ORAL | 0 refills | Status: DC

## 2021-02-14 MED ORDER — PREDNISONE 10 MG (21) PO TBPK: ORAL_TABLET | ORAL | 0 refills | Status: DC

## 2021-02-14 NOTE — Progress Notes (Signed)
Virtual Visit Consent   Mercedes Koch, you are scheduled for a virtual visit with a [facility] provider today.     Just as with appointments in the office, your consent must be obtained to participate.  Your consent will be active for this visit and any virtual visit you may have with one of our providers in the next 365 days.     If you have a MyChart account, a copy of this consent can be sent to you electronically.  All virtual visits are billed to your insurance company just like a traditional visit in the office.    As this is a virtual visit, video technology does not allow for your provider to perform a traditional examination.  This may limit your provider's ability to fully assess your condition.  If your provider identifies any concerns that need to be evaluated in person or the need to arrange testing (such as labs, EKG, etc.), we will make arrangements to do so.     Although advances in technology are sophisticated, we cannot ensure that it will always work on either your end or our end.  If the connection with a video visit is poor, the visit may have to be switched to a telephone visit.  With either a video or telephone visit, we are not always able to ensure that we have a secure connection.     I need to obtain your verbal consent now.   Are you willing to proceed with your visit today?    Mercedes Koch has provided verbal consent on 02/14/2021 for a virtual visit (video or telephone).   Jannifer Rodney, FNP   Date: 02/14/2021 3:09 PM   Virtual Visit via Video Note   I, Jannifer Rodney, connected with  Mercedes Koch  ([id number], 12/18/1990) on 02/14/21 at  3:00 PM EDT by a video-enabled telemedicine application and verified that I am speaking with the correct person using two identifiers.  Location: Patient: Virtual Visit Location Patient: Other: work Provider: Engineer, mining Provider: Home   I discussed the limitations of evaluation and management by  telemedicine and the availability of in person appointments. The patient expressed understanding and agreed to proceed.    History of Present Illness: Mercedes Koch is a 30 y.o. who identifies as a female who was assigned female at birth, and is being seen today for cough..  HPI: Cough This is a new problem. The current episode started 1 to 4 weeks ago. The problem has been waxing and waning. The problem occurs every few minutes. The cough is Productive of sputum. Associated symptoms include chills, ear pain (right), a fever (resolved), headaches, myalgias, nasal congestion, postnasal drip and a sore throat. Pertinent negatives include no ear congestion, shortness of breath or wheezing. She has tried rest and OTC cough suppressant for the symptoms. The treatment provided mild relief.   Problems:  Patient Active Problem List   Diagnosis Date Noted   Prediabetes 03/19/2020   Vitamin D deficiency 09/09/2019   Hepatic steatosis 09/09/2019   Migraine with visual aura 08/17/2019   Seizures (HCC) 08/17/2019   SBO (small bowel obstruction) (HCC) 08/10/2019   Cystitis 08/10/2019   UTI (urinary tract infection) 08/10/2019   Epilepsy (HCC)    Generalized abdominal pain    History of abdominal surgery    Hypothyroidism    History of anemia 06/10/2019   Hypertension 06/10/2019   Metabolic syndrome 06/10/2019   PCOS (polycystic ovarian syndrome) 06/10/2019   Secondary  oligomenorrhea 06/10/2019    Allergies: No Known Allergies Medications:  Current Outpatient Medications:    azithromycin (ZITHROMAX) 250 MG tablet, Take 500 mg once, then 250 mg for four days, Disp: 6 tablet, Rfl: 0   benzonatate (TESSALON PERLES) 100 MG capsule, Take 1 capsule (100 mg total) by mouth 3 (three) times daily as needed., Disp: 20 capsule, Rfl: 0   predniSONE (STERAPRED UNI-PAK 21 TAB) 10 MG (21) TBPK tablet, Use as directed, Disp: 21 tablet, Rfl: 0   clomiPHENE (CLOMID) 50 MG tablet, Take by mouth. (Patient not  taking: No sig reported), Disp: , Rfl:    EUTHYROX 100 MCG tablet, Take 1 tablet by mouth once daily, Disp: 30 tablet, Rfl: 0   levothyroxine (SYNTHROID) 100 MCG tablet, Take 100 mcg by mouth daily before breakfast., Disp: , Rfl:    medroxyPROGESTERone (PROVERA) 10 MG tablet, Take 1 tablet by mouth 3 (three) times daily. (Patient not taking: No sig reported), Disp: , Rfl:    metFORMIN (GLUCOPHAGE-XR) 500 MG 24 hr tablet, Take 500 mg by mouth daily., Disp: , Rfl:   Observations/Objective: Patient is well-developed, well-nourished in no acute distress.  Resting comfortably  at home.  Head is normocephalic, atraumatic.  No labored breathing.  Speech is clear and coherent with logical content.  Patient is alert and oriented at baseline.  Nasal congestion   Assessment and Plan: 1. Acute bacterial bronchitis - azithromycin (ZITHROMAX) 250 MG tablet; Take 500 mg once, then 250 mg for four days  Dispense: 6 tablet; Refill: 0 - predniSONE (STERAPRED UNI-PAK 21 TAB) 10 MG (21) TBPK tablet; Use as directed  Dispense: 21 tablet; Refill: 0 - benzonatate (TESSALON PERLES) 100 MG capsule; Take 1 capsule (100 mg total) by mouth 3 (three) times daily as needed.  Dispense: 20 capsule; Refill: 0 - Take meds as prescribed - Use a cool mist humidifier  -Use saline nose sprays frequently -Force fluids -For any cough or congestion  Use plain Mucinex- regular strength or max strength is fine -For fever or aces or pains- take tylenol or ibuprofen. -Throat lozenges if help -Follow up if symptoms worsen or do not improve  Follow Up Instructions: I discussed the assessment and treatment plan with the patient. The patient was provided an opportunity to ask questions and all were answered. The patient agreed with the plan and demonstrated an understanding of the instructions.  A copy of instructions were sent to the patient via MyChart unless otherwise noted below.     The patient was advised to call back or  seek an in-person evaluation if the symptoms worsen or if the condition fails to improve as anticipated.  Time:  I spent 8 minutes with the patient via telehealth technology discussing the above problems/concerns.    Jannifer Rodney, FNP

## 2021-02-14 NOTE — Patient Instructions (Signed)

## 2021-02-18 ENCOUNTER — Other Ambulatory Visit: Payer: Self-pay

## 2021-02-18 DIAGNOSIS — H538 Other visual disturbances: Secondary | ICD-10-CM

## 2021-02-18 DIAGNOSIS — R519 Headache, unspecified: Secondary | ICD-10-CM

## 2021-03-15 ENCOUNTER — Telehealth: Payer: Self-pay | Admitting: Family Medicine

## 2021-03-15 DIAGNOSIS — E039 Hypothyroidism, unspecified: Secondary | ICD-10-CM

## 2021-03-16 ENCOUNTER — Encounter: Payer: Self-pay | Admitting: Family Medicine

## 2021-03-16 ENCOUNTER — Other Ambulatory Visit: Payer: Self-pay

## 2021-03-16 DIAGNOSIS — R7303 Prediabetes: Secondary | ICD-10-CM

## 2021-03-16 DIAGNOSIS — E039 Hypothyroidism, unspecified: Secondary | ICD-10-CM

## 2021-03-16 DIAGNOSIS — G629 Polyneuropathy, unspecified: Secondary | ICD-10-CM

## 2021-03-16 DIAGNOSIS — R7989 Other specified abnormal findings of blood chemistry: Secondary | ICD-10-CM

## 2021-03-16 DIAGNOSIS — E559 Vitamin D deficiency, unspecified: Secondary | ICD-10-CM

## 2021-04-05 ENCOUNTER — Telehealth: Payer: Self-pay | Admitting: Family Medicine

## 2021-04-05 DIAGNOSIS — E039 Hypothyroidism, unspecified: Secondary | ICD-10-CM

## 2021-04-12 ENCOUNTER — Telehealth: Payer: Self-pay | Admitting: Family Medicine

## 2021-04-26 ENCOUNTER — Other Ambulatory Visit: Payer: Self-pay | Admitting: Family Medicine

## 2021-04-26 ENCOUNTER — Encounter: Payer: Self-pay | Admitting: Family Medicine

## 2021-04-26 DIAGNOSIS — R7989 Other specified abnormal findings of blood chemistry: Secondary | ICD-10-CM

## 2021-04-26 DIAGNOSIS — E039 Hypothyroidism, unspecified: Secondary | ICD-10-CM

## 2021-04-27 NOTE — Telephone Encounter (Signed)
° °  Requested medication (s) are on the active medication list: yes  Last refill:  11/11/20  Future visit scheduled: no  Notes to clinic:  Failed protocol of labs (TSH) within 360 days, no upcoming appt, please assess.   Requested Prescriptions  Pending Prescriptions Disp Refills   levothyroxine (SYNTHROID) 100 MCG tablet [Pharmacy Med Name: Levothyroxine Sodium 100 MCG Oral Tablet] 30 tablet 0    Sig: Take 1 tablet by mouth once daily     Endocrinology:  Hypothyroid Agents Failed - 04/26/2021 10:44 PM      Failed - TSH needs to be rechecked within 3 months after an abnormal result. Refill until TSH is due.      Failed - TSH in normal range and within 360 days    TSH  Date Value Ref Range Status  09/09/2019 5.36 (H) mIU/L Final    Comment:              Reference Range .           > or = 20 Years  0.40-4.50 .                Pregnancy Ranges           First trimester    0.26-2.66           Second trimester   0.55-2.73           Third trimester    0.43-2.91           Passed - Valid encounter within last 12 months    Recent Outpatient Visits           2 months ago Hypothyroidism, unspecified type   [facility] Danelle Berry, PA-C   1 year ago Hypothyroidism, unspecified type   [facility] Danelle Berry, PA-C   1 year ago Hypothyroidism, unspecified type   [facility] Danelle Berry, PA-C   1 year ago Adult general medical exam   [facility] Danelle Berry, PA-C   1 year ago Nonintractable episodic headache, unspecified headache type   [facility] Danelle Berry, [place]

## 2021-04-28 NOTE — Telephone Encounter (Signed)
Tried calling the patient to schedule appt but mail box was full. Pt needs appt

## 2021-05-14 ENCOUNTER — Other Ambulatory Visit: Payer: Self-pay | Admitting: Family Medicine

## 2021-05-14 DIAGNOSIS — R7989 Other specified abnormal findings of blood chemistry: Secondary | ICD-10-CM

## 2021-05-14 DIAGNOSIS — E039 Hypothyroidism, unspecified: Secondary | ICD-10-CM

## 2021-05-14 LAB — TSH: TSH: 3.8 u[IU]/mL (ref 0.450–4.500)

## 2021-05-14 LAB — HEMOGLOBIN A1C
Est. average glucose Bld gHb Est-mCnc: 111 mg/dL
Hgb A1c MFr Bld: 5.5 % (ref 4.8–5.6)

## 2021-05-14 LAB — VITAMIN D 25 HYDROXY (VIT D DEFICIENCY, FRACTURES): Vit D, 25-Hydroxy: 17.1 ng/mL — ABNORMAL LOW (ref 30.0–100.0)

## 2021-05-19 MED ORDER — LEVOTHYROXINE SODIUM 50 MCG PO TABS: 50.0000 ug | ORAL_TABLET | Freq: Every day | ORAL | 2 refills | Status: DC

## 2021-07-27 ENCOUNTER — Telehealth (INDEPENDENT_AMBULATORY_CARE_PROVIDER_SITE_OTHER): Payer: Self-pay | Admitting: Family Medicine

## 2021-07-27 ENCOUNTER — Encounter: Payer: Self-pay | Admitting: Family Medicine

## 2021-07-27 DIAGNOSIS — R519 Headache, unspecified: Secondary | ICD-10-CM

## 2021-07-27 DIAGNOSIS — Z1159 Encounter for screening for other viral diseases: Secondary | ICD-10-CM

## 2021-07-27 DIAGNOSIS — Z6841 Body Mass Index (BMI) 40.0 and over, adult: Secondary | ICD-10-CM

## 2021-07-27 DIAGNOSIS — E039 Hypothyroidism, unspecified: Secondary | ICD-10-CM

## 2021-07-27 DIAGNOSIS — G40909 Epilepsy, unspecified, not intractable, without status epilepticus: Secondary | ICD-10-CM

## 2021-07-27 DIAGNOSIS — R7303 Prediabetes: Secondary | ICD-10-CM

## 2021-07-27 DIAGNOSIS — I10 Essential (primary) hypertension: Secondary | ICD-10-CM

## 2021-07-27 DIAGNOSIS — E559 Vitamin D deficiency, unspecified: Secondary | ICD-10-CM

## 2021-07-27 MED ORDER — AMITRIPTYLINE HCL 25 MG PO TABS: 25.0000 mg | ORAL_TABLET | Freq: Every day | ORAL | 1 refills | Status: DC

## 2021-07-27 NOTE — Progress Notes (Signed)
? ?Name: Mercedes GraceHeather L Mccullum   MRN: [id number]    DOB: 07/30/1990   Date:07/27/2021 ? ?     Progress Note ? ?Subjective:  ? ?Chief Complaint ? ?Chief Complaint  ?Patient presents with  ? Follow-up  ? Hypothyroidism  ? PreDM  ? Hypertension  ? ?I connected with  Mercedes Koch  on 07/27/21 at 11:20 AM EDT by a video enabled telemedicine application and verified that I am speaking with the correct person using two identifiers.  I discussed the limitations of evaluation and management by telemedicine and the availability of in person appointments. The patient expressed understanding and agreed to proceed. Staff also discussed with the patient that there may be a patient responsible charge related to this service. ?Patient Location:  home ?Provider Location: [facility] ?Additional Individuals present: none ? ?Headache  ?This is a chronic problem. The current episode started more than 1 year ago (onset 2019). Episode frequency: every other day. The problem has been unchanged. The pain is located in the Occipital region. The pain does not radiate. The pain quality is similar to prior headaches. The quality of the pain is described as aching and throbbing (pounding). The pain is moderate. Associated symptoms include blurred vision, dizziness and a visual change. Pertinent negatives include no abdominal pain, back pain, drainage, ear pain, eye pain, eye redness, eye watering, fever, loss of balance, muscle aches, nausea, neck pain, numbness, phonophobia, photophobia, rhinorrhea, scalp tenderness, sinus pressure, sore throat, swollen glands, tingling, tinnitus, vomiting, weakness or weight loss. Nothing aggravates the symptoms. She has tried triptans, acetaminophen and NSAIDs for the symptoms. The treatment provided no relief. Her past medical history is significant for obesity.  ?She has seen neurology at [facility] for seizure-like activity and did work up, was prescribed nortriptyline took for a few months but didn't help and  she didn't f/up - Dr. Malvin JohnsPotter  ? ?Presents for f/up of hypothyroid and vit D deficiency ? ?On 50 mcg daily she feels better with this dose ?Lab Results  ?Component Value Date  ? TSH 3.800 05/13/2021  ?Last labs checked were at goal ? ?Vit D has improved some and she is still taking supplements, but last labs still <20 ?Last vitamin D ?Lab Results  ?Component Value Date  ? VD25OH 17.1 (L) 05/13/2021  ? ? ? ?Obesity: ?Wt Readings from Last 15 Encounters:  ?02/01/21 274 lb 14.4 oz (124.7 kg)  ?01/14/21 270 lb (122.5 kg)  ?03/03/20 280 lb 4.8 oz (127.1 kg)  ?09/09/19 284 lb 3.2 oz (128.9 kg)  ?08/10/19 298 lb 4.5 oz (135.3 kg)  ?06/10/19 298 lb 3.2 oz (135.3 kg)  ?06/18/16 260 lb (117.9 kg)  ?02/28/13 218 lb (98.9 kg)  ?10/14/12 225 lb (102.1 kg)  ?02/01/12 200 lb (90.7 kg)  ?08/22/11 210 lb (95.3 kg)  ? ?BMI Readings from Last 5 Encounters:  ?02/01/21 44.37 kg/m?  ?01/14/21 44.93 kg/m?  ?03/03/20 46.64 kg/m?  ?09/09/19 47.29 kg/m?  ?08/10/19 49.64 kg/m?  ? ? ? ?Patient Active Problem List  ? Diagnosis Date Noted  ? Vitamin D deficiency 09/09/2019  ? Hepatic steatosis 09/09/2019  ? Headache, unspecified 08/17/2019  ? Epilepsy (HCC)   ? Hypothyroidism   ? Hypertension 06/10/2019  ? Metabolic syndrome 06/10/2019  ? PCOS (polycystic ovarian syndrome) 06/10/2019  ? Class 3 severe obesity with serious comorbidity and body mass index (BMI) of 40.0 to 44.9 in adult [facility](HCC) 06/10/2019  ? ? ?Social History  ? ?Tobacco Use  ? Smoking status: Never  ?  Smokeless tobacco: Never  ?Substance Use Topics  ? Alcohol use: No  ? ? ? ?Current Outpatient Medications:  ?  amitriptyline (ELAVIL) 25 MG tablet, Take 1 tablet (25 mg total) by mouth at bedtime., Disp: 90 tablet, Rfl: 1 ?  levothyroxine (EUTHYROX) 50 MCG tablet, Take 1 tablet (50 mcg total) by mouth daily before breakfast., Disp: 30 tablet, Rfl: 2 ?  medroxyPROGESTERone (PROVERA) 10 MG tablet, Take 1 tablet by mouth 3 (three) times daily., Disp: , Rfl:  ?  metFORMIN (GLUCOPHAGE-XR)  500 MG 24 hr tablet, Take 500 mg by mouth daily., Disp: , Rfl:  ? ?No Known Allergies ? ?I personally reviewed active problem list, medication list, allergies, family history, social history, health maintenance, notes from last encounter, lab results, imaging with the patient/caregiver today. ? ? ?Review of Systems  ?Constitutional:  Negative for fever and weight loss.  ?HENT:  Negative for ear pain, rhinorrhea, sinus pressure, sore throat and tinnitus.   ?Eyes:  Positive for blurred vision. Negative for photophobia, pain and redness.  ?Gastrointestinal:  Negative for abdominal pain, nausea and vomiting.  ?Musculoskeletal:  Negative for back pain and neck pain.  ?Neurological:  Positive for dizziness and headaches. Negative for tingling, weakness, numbness and loss of balance.   ? ? ?Objective:  ? ?Virtual encounter, vitals limited, only able to obtain the following ?There were no vitals filed for this visit. ?There is no height or weight on file to calculate BMI. ?Nursing Note and Vital Signs reviewed. ? ?Physical Exam ?Vitals and nursing note reviewed.  ?Constitutional:   ?   General: She is not in acute distress. ?   Appearance: She is obese. She is not ill-appearing, toxic-appearing or diaphoretic.  ?Pulmonary:  ?   Effort: No respiratory distress.  ?Neurological:  ?   Mental Status: She is alert.  ?   Cranial Nerves: No dysarthria or facial asymmetry.  ? ? ?PE limited by virtual encounter ? ?No results found for this or any previous visit (from the past 72 hour(s)). ? ?Assessment and Plan:  ? ?  ICD-10-CM   ?1. Hypothyroidism, unspecified type  E03.9 TSH  ? will do a f/up TSH, continue 50 mcg daily - reviewed med administration, empty stomach, do not take with acid reducers or supplements (w/in 4 hours)  ?  ?2. Intractable headache, unspecified chronicity pattern, unspecified headache type  R51.9 amitriptyline (ELAVIL) 25 MG tablet  ?  Ambulatory referral to Neurology  ? every few days, occipital, pounding,  with prodromal visual disturbances - scotoma and blurred vision, using OTC meds, needs to f/up with neuro  ?  ?3. Class 3 severe obesity with serious comorbidity and body mass index (BMI) of 40.0 to 44.9 in adult, unspecified obesity type (HCC)  E66.01 Comprehensive metabolic panel  ? Z68.41 Ambulatory referral to Neurology  ? weight significantly increased near the onset of HA's  ?  ?4. Nonintractable epilepsy without status epilepticus, unspecified epilepsy type (HCC)  G40.909 Ambulatory referral to Neurology  ? hx of - needs new neurologist  ?  ?5. Vitamin D deficiency  E55.9 VITAMIN D 25 Hydroxy (Vit-D Deficiency, Fractures)  ?  Comprehensive metabolic panel  ?  ?6. Encounter for hepatitis C screening test for low risk patient  Z11.59 Hepatitis C antibody  ?  ?  ?Refer to [facility] charity care?  Possible new neuro consult at [facility] ?Trial of low dose TCA for HA's, pt instructed to keep HA journal -headaches are frequent, currently every couple days, sometimes present  when waking, occipital and bilateral with a throbbing quality.  Encouraged her to get her vision checked. ?I reviewed neurology visits which were from 2021 she went once mostly presenting for seizure-like activity and EEG procedure was done and there was one follow-up appointment where nortriptyline was started at a low dose with a taper from 10 to 20 mg and patient never followed up.  Last year we again put the referral in and [facility] neurology reached out to the patient but did not reach her and she did not do any further follow-up despite continuing to have frequent and gradually worsening headaches. ? ?Did discuss the differential with the patient -tension headaches, may be related to eyestrain, migraines possible with prodromal scotoma features, abortive medications may be helpful if she is able to have less frequent headaches, also in the differential possibly be IIH?  She did have a significant increase in her weight around the time that her  headaches began.  OSA also possible etiology.  Significant barriers to care include lack of insurance and financial limitations.  Fortunately headaches are not severe enough to cause her to go to the ER for trea

## 2021-07-29 MED ORDER — SUMATRIPTAN SUCCINATE 25 MG PO TABS: ORAL_TABLET | ORAL | 1 refills | Status: AC

## 2021-07-29 NOTE — Patient Instructions (Addendum)
I put in a medication that may help reduce your frequent headaches.  Take at night, may cause sleepiness.  Do not take if you may be pregnant. ?It will take time to work - so try to take for a month or two before you give up.  It is often used with chronic headaches, post-traumatic headaches, and headaches related to musculoskeletal pain.   ? ?If you have a particularly severe headache coming on you can try a dose of the imitrex to see if it gets rid of it.  Imitrex should be only used a few times a month.  Overall you should avoid using any abortive meds more than 8 times a month for headaches - that included imitrex, tylenol, ibuprofen (aleve, naproxen, goody powders etc) because it can CAUSE headaches - called medication overuse headache    ? ?(The amitriptyline is a preventative medicine, not an abortive - so that is the only one that should be used daily) ? ?Keep a headache journal documenting the days/times you have headaches, what they feel like, where they are located, how severe they are, and other notes around the days/times you have them (sleep, foods, stress, etc).  Keeping records of your headaches is extremely helpful to identifying patterns or triggers (some people find certain foods, allergens or behaviors that are related) ? ?I put in a referral to see neurology at [facility] ? ?Please see [facility] charity care website and follow steps (or call) to start application for their financial assistance: ? ?[email or web address] ? ?Apply for Financial Assistance ?You can conveniently apply for [facility] financial assistance through your existing My [facility] Chart account or a new one you create. My [facility] Chart is our secure, online patient portal where you can apply for financial assistance, submit your supporting documents, and more. ? ?Apply by Fax or Mail ?Submit your complete financial assistance application and supporting  documents: ?Mail to: ?[employer] ?[address] ?[place], [place] [address] ?Secure fax: [phone] ? ?Contact Financial Assistance ?Speak with a Financial Navigator Monday through Thursday, 8:30 a.m. - 4:30 p.m. and Friday, 8:30 a.m. - 12:30 p.m. For your privacy and security, you will need to provide your account number and other information before we can discuss your account. ?[phone] (toll-free) ?([phone]) [phone] ? ?General Headache Without Cause ?A headache is pain or discomfort felt around the head or neck area. There are many causes and types of headaches. A few common types include: ?Tension headaches. ?Migraine headaches. ?Cluster headaches. ?Chronic daily headaches. ?Sometimes, the specific cause of a headache may not be found. ?Follow these instructions at home: ?Watch your condition for any changes. Let your health care provider know about them. Take these steps to help with your condition: ?Managing pain ?  ?Take over-the-counter and prescription medicines only as told by your health care provider. Treatment may include medicines for pain that are taken by mouth or applied to the skin. ?Lie down in a dark, quiet room when you have a headache. ?Keep lights dim if bright lights bother you or make your headaches worse. ?If directed, put ice on your head and neck area: ?Put ice in a plastic bag. ?Place a towel between your skin and the bag. ?Leave the ice on for 20 minutes, 2-3 times per day. ?Remove the ice if your skin turns bright red. This is very important. If you cannot feel pain, heat, or cold, you have a greater risk of damage to the area. ?If directed, apply heat  to the affected area. Use the heat source that your health care provider recommends, such as a moist heat pack or a heating pad. ?Place a towel between your skin and the heat source. ?Leave the heat on for 20-30 minutes. ?Remove the heat if your skin turns bright red. This is especially important if  you are unable to feel pain, heat, or cold. You have a greater risk of getting burned. ?Eating and drinking ?Eat meals on a regular schedule. ?If you drink alcohol: ?Limit how much you have to: ?0-1 drink a day for women who are not pregnant. ?0-2 drinks a day for men. ?Know how much alcohol is in a drink. In the [place] one drink equals one 12 oz bottle of beer (355 mL), one 5 oz glass of wine (148 mL), or one 1? oz glass of hard liquor (44 mL). ?Stop drinking caffeine, or decrease the amount of caffeine you drink. ?Drink enough fluid to keep your urine pale yellow. ?General instructions ? ?Keep a headache journal to help find out what may trigger your headaches. For example, write down: ?What you eat and drink. ?How much sleep you get. ?Any change to your diet or medicines. ?Try massage or other relaxation techniques. ?Limit stress. ?Sit up straight, and do not tense your muscles. ?Do not use any products that contain nicotine or tobacco. These products include cigarettes, chewing tobacco, and vaping devices, such as e-cigarettes. If you need help quitting, ask your health care provider. ?Exercise regularly as told by your health care provider. ?Sleep on a regular schedule. Get 7-9 hours of sleep each night, or the amount recommended by your health care provider. ?Keep all follow-up visits. This is important. ?Contact a health care provider if: ?Medicine does not help your symptoms. ?You have a headache that is different from your usual headache. ?You have nausea or you vomit. ?You have a fever. ?Get help right away if: ?Your headache: ?Becomes severe quickly. ?Gets worse after moderate to intense physical activity. ?You have any of these symptoms: ?Repeated vomiting. ?Pain or stiffness in your neck. ?Changes to your vision. ?Pain in an eye or ear. ?Problems with speech. ?Muscular weakness or loss of muscle control. ?Loss of balance or coordination. ?You feel faint or pass out. ?You have confusion. ?You have a  seizure. ?These symptoms may represent a serious problem that is an emergency. Do not wait to see if the symptoms will go away. Get medical help right away. Call your local emergency services (911 in the [place] Do not drive yourself to the hospital. ?Summary ?A headache is pain or discomfort felt around the head or neck area. ?There are many causes and types of headaches. In some cases, the cause may not be found. ?Keep a headache journal to help find out what may trigger your headaches. Watch your condition for any changes. Let your health care provider know about them. ?Contact a health care provider if you have a headache that is different from the usual headache, or if your symptoms are not helped by medicine. ?Get help right away if your headache becomes severe, you vomit, you have a loss of vision, you lose your balance, or you have a seizure. ?This information is not intended to replace advice given to you by your health care provider. Make sure you discuss any questions you have with your health care provider. ?Document Revised: 09/23/2020 Document Reviewed: 09/23/2020 ?Elsevier Patient Education ? 2022 Elsevier Inc. ? ? ? ?

## 2021-08-31 ENCOUNTER — Other Ambulatory Visit: Payer: Self-pay | Admitting: Physician Assistant

## 2021-08-31 LAB — COMPREHENSIVE METABOLIC PANEL
ALT: 23 IU/L (ref 0–32)
AST: 18 IU/L (ref 0–40)
Albumin/Globulin Ratio: 1.7 (ref 1.2–2.2)
Albumin: 4.3 g/dL (ref 3.8–4.8)
Alkaline Phosphatase: 73 IU/L (ref 44–121)
BUN/Creatinine Ratio: 16 (ref 9–23)
BUN: 11 mg/dL (ref 6–20)
Bilirubin Total: 0.3 mg/dL (ref 0.0–1.2)
CO2: 22 mmol/L (ref 20–29)
Calcium: 9.7 mg/dL (ref 8.7–10.2)
Chloride: 101 mmol/L (ref 96–106)
Creatinine, Ser: 0.69 mg/dL (ref 0.57–1.00)
Globulin, Total: 2.6 g/dL (ref 1.5–4.5)
Glucose: 113 mg/dL — ABNORMAL HIGH (ref 70–99)
Potassium: 4.1 mmol/L (ref 3.5–5.2)
Sodium: 140 mmol/L (ref 134–144)
Total Protein: 6.9 g/dL (ref 6.0–8.5)
eGFR: 119 mL/min/{1.73_m2} (ref >59)

## 2021-08-31 LAB — HEPATITIS C ANTIBODY: Hep C Virus Ab: NONREACTIVE

## 2021-08-31 LAB — TSH: TSH: 2.67 u[IU]/mL (ref 0.450–4.500)

## 2021-08-31 LAB — VITAMIN D 25 HYDROXY (VIT D DEFICIENCY, FRACTURES): Vit D, 25-Hydroxy: 28.7 ng/mL — ABNORMAL LOW (ref 30.0–100.0)

## 2021-09-01 NOTE — Telephone Encounter (Signed)
Requested Prescriptions  ?Pending Prescriptions Disp Refills  ?? levothyroxine (SYNTHROID) 50 MCG tablet [Pharmacy Med Name: Levothyroxine Sodium 50 MCG Oral Tablet] 90 tablet 0  ?  Sig: TAKE 1 TABLET BY MOUTH BEFORE BREAKFAST  ?  ? Endocrinology:  Hypothyroid Agents Passed - 08/31/2021  7:54 AM  ?  ?  Passed - TSH in normal range and within 360 days  ?  TSH  ?Date Value Ref Range Status  ?08/30/2021 2.670 0.450 - 4.500 uIU/mL Final  ?   ?  ?  Passed - Valid encounter within last 12 months  ?  Recent Outpatient Visits   ?      ? 1 month ago Hypothyroidism, unspecified type  ? [facility], Sheliah Mends, PA-C  ? 7 months ago Hypothyroidism, unspecified type  ? [facility] Danelle Berry, PA-C  ? 1 year ago Hypothyroidism, unspecified type  ? [facility] Danelle Berry, PA-C  ? 1 year ago Hypothyroidism, unspecified type  ? [facility] Danelle Berry, PA-C  ? 1 year ago Adult general medical exam  ? [facility] Danelle Berry, PA-C  ?  ?  ? ?  ?  ?  ? ? ?

## 2021-09-02 ENCOUNTER — Encounter: Payer: Self-pay | Admitting: Family Medicine

## 2021-09-07 ENCOUNTER — Ambulatory Visit: Payer: Self-pay | Admitting: Family Medicine

## 2021-09-07 ENCOUNTER — Telehealth: Payer: Self-pay | Admitting: *Deleted

## 2021-09-07 ENCOUNTER — Encounter: Payer: Self-pay | Admitting: Family Medicine

## 2021-09-07 VITALS — BP 126/80 | HR 89 | Resp 16 | Ht 66.0 in | Wt 277.0 lb

## 2021-09-07 DIAGNOSIS — Z5971 Insufficient health insurance coverage: Secondary | ICD-10-CM

## 2021-09-07 DIAGNOSIS — R21 Rash and other nonspecific skin eruption: Secondary | ICD-10-CM

## 2021-09-07 DIAGNOSIS — R519 Headache, unspecified: Secondary | ICD-10-CM

## 2021-09-07 DIAGNOSIS — G8929 Other chronic pain: Secondary | ICD-10-CM

## 2021-09-07 DIAGNOSIS — R5383 Other fatigue: Secondary | ICD-10-CM

## 2021-09-07 DIAGNOSIS — Z5989 Other problems related to housing and economic circumstances: Secondary | ICD-10-CM

## 2021-09-07 DIAGNOSIS — E282 Polycystic ovarian syndrome: Secondary | ICD-10-CM

## 2021-09-07 DIAGNOSIS — R569 Unspecified convulsions: Secondary | ICD-10-CM

## 2021-09-07 DIAGNOSIS — Z6841 Body Mass Index (BMI) 40.0 and over, adult: Secondary | ICD-10-CM

## 2021-09-07 DIAGNOSIS — E039 Hypothyroidism, unspecified: Secondary | ICD-10-CM

## 2021-09-07 MED ORDER — LEVOTHYROXINE SODIUM 75 MCG PO TABS: ORAL_TABLET | ORAL | 1 refills | Status: DC

## 2021-09-07 NOTE — Chronic Care Management (AMB) (Signed)
?  Care Management  ? ?Note ? ?09/07/2021 ?Name: Mercedes Koch MRN: [id number] DOB: [birth year] ? ?[name] is a 31 y.o. year old female who is a primary care patient of Danelle Berry, [place]. I reached out to [name] by phone today offer care coordination services.  ? ?[name] was given information about care management services today including:  ?Care management services include personalized support from designated clinical staff supervised by her physician, including individualized plan of care and coordination with other care providers ?24/7 contact phone numbers for assistance for urgent and routine care needs. ?The patient may stop care management services at any time by phone call to the office staff. ? ?Patient agreed to services and verbal consent obtained.  ? ?Follow up plan: ?Telephone appointment with care management team member scheduled for: 09/13/2021 ? ?Trea Latner, CCMA ?Care Guide, Embedded Care Coordination ?[place]  Care Management  ?Direct Dial: [phone] ?  ?

## 2021-09-07 NOTE — Patient Instructions (Signed)
Try vaseline and creams for rash ?If still occurring in 1-2 weeks then try to add  ? ?lactic acid, salicylic acid - can try these to the area  ?

## 2021-09-07 NOTE — Progress Notes (Signed)
? ? ?Patient ID: Mercedes Koch, female    DOB: 12-01-1990, 31 y.o.   MRN: [id number] ? ?PCP: Danelle Berry, PA-C ? ?Chief Complaint  ?Patient presents with  ? Discuss Medication  ?  Still tired/no energy  ? ? ?Subjective:  ? ?Mercedes Koch is a 31 y.o. female, presents to clinic with CC of the following: ? ?HPI  ?Presents for fatigue/thyroid f/up and rash ? ?Hypothyroidism: ?Current Medication Regimen: 50 mcg synthroid - last year poor med compliance and f/up, TSH was 5-8, attempted to get her back up to 100 mcg dose, this made her feel bad when she finally consistently took meds, and she felt better with 50 mcg dose, last labs over the past 6 months have been in normal range but she f/up today stating dry skin, brain fog/zoning out, rashes, fatigue, constipation weight gain  ?Takes medicine correctly ?Most recent results are below; will do f/up labs in 6-8 weeks after dose change ? ?Lab Results  ?Component Value Date  ? TSH 2.670 08/30/2021  ? TSH 3.800 05/13/2021  ? TSH 5.36 (H) 09/09/2019  ? TSH 8.04 (H) 06/10/2019  ? ? ?Obesity: ?Weight up 7 lbs in the past 9 months ?Wt Readings from Last 5 Encounters:  ?09/07/21 277 lb (125.6 kg)  ?02/01/21 274 lb 14.4 oz (124.7 kg)  ?01/14/21 270 lb (122.5 kg)  ?03/03/20 280 lb 4.8 oz (127.1 kg)  ?09/09/19 284 lb 3.2 oz (128.9 kg)  ? ?BMI Readings from Last 5 Encounters:  ?09/07/21 44.71 kg/m?  ?02/01/21 44.37 kg/m?  ?01/14/21 44.93 kg/m?  ?03/03/20 46.64 kg/m?  ?09/09/19 47.29 kg/m?  ? ? ?Chronic HA - on amitriptyline 25 mg at bedtime- starting taking in march, Has started to improve in about a week  ?Headaches better ?Not needing OTC meds and not needing Imitrex  ?No SE or concerns on med or noted with starting med ?Neuro previously started her on nortriptyline and she did not tolerate it ? ?Seizure disorder  - has not gotten into neuro or talked to [facility] or [facility] ?No insurance - barrier to care ?Reviewed again visits with Dr. Ardine Eng  clinic/[facility] everywhere - EEG done but I am unable to see results ? ?Rash to arms and front of thighs, coming and going, raised dry, sometimes itches, trying hypoallergenic lotions no fragrance, and trying hydrocortisone but it keeps coming back ?No new foods, detergents, soaps etc ?Dry raised slightly red bumpy  ? ? ?Patient Active Problem List  ? Diagnosis Date Noted  ? Vitamin D deficiency 09/09/2019  ? Hepatic steatosis 09/09/2019  ? Headache, unspecified 08/17/2019  ? Epilepsy (HCC)   ? Hypothyroidism   ? Hypertension 06/10/2019  ? Metabolic syndrome 06/10/2019  ? PCOS (polycystic ovarian syndrome) 06/10/2019  ? Class 3 severe obesity with serious comorbidity and body mass index (BMI) of 40.0 to 44.9 in adult [facility]) 06/10/2019  ? ? ? ? ?Current Outpatient Medications:  ?  amitriptyline (ELAVIL) 25 MG tablet, Take 1 tablet (25 mg total) by mouth at bedtime., Disp: 90 tablet, Rfl: 1 ?  metFORMIN (GLUCOPHAGE-XR) 500 MG 24 hr tablet, Take 500 mg by mouth daily., Disp: , Rfl:  ?  SUMAtriptan (IMITREX) 25 MG tablet, Take 25 to 50 mg PO at onset of headache, may repeat dose after 2 hours if HA persists.  Maximum daily dose 200 mg, Disp: 20 tablet, Rfl: 1 ?  levothyroxine (SYNTHROID) 75 MCG tablet, TAKE 1 TABLET BY MOUTH BEFORE BREAKFAST, Disp: 90  tablet, Rfl: 1 ?  medroxyPROGESTERone (PROVERA) 10 MG tablet, Take 1 tablet by mouth 3 (three) times daily. (Patient not taking: Reported on 09/07/2021), Disp: , Rfl:  ? ? ?No Known Allergies ? ? ?Social History  ? ?Tobacco Use  ? Smoking status: Never  ? Smokeless tobacco: Never  ?Vaping Use  ? Vaping Use: Never used  ?Substance Use Topics  ? Alcohol use: No  ? Drug use: No  ?  ? ? ?Chart Review Today: ?I personally reviewed active problem list, medication list, allergies, family history, social history, health maintenance, notes from last encounter, lab results, imaging with the patient/caregiver today. ? ? ?Review of Systems  ?Constitutional: Negative.   ?HENT: Negative.     ?Eyes: Negative.   ?Respiratory: Negative.    ?Cardiovascular: Negative.   ?Gastrointestinal: Negative.   ?Endocrine: Negative.   ?Genitourinary: Negative.   ?Musculoskeletal: Negative.   ?Skin: Negative.   ?Allergic/Immunologic: Negative.   ?Neurological: Negative.   ?Hematological: Negative.   ?Psychiatric/Behavioral: Negative.    ?All other systems reviewed and are negative. ? ?   ?Objective:  ? ?Vitals:  ? 09/07/21 0938  ?BP: 126/80  ?Pulse: 89  ?Resp: 16  ?SpO2: 94%  ?Weight: 277 lb (125.6 kg)  ?Height: 5\' 6"  (1.676 m)  ?  ?Body mass index is 44.71 kg/m?. ? ?Physical Exam ?Vitals and nursing note reviewed.  ?Constitutional:   ?   General: She is not in acute distress. ?   Appearance: Normal appearance. She is well-developed and well-groomed. She is morbidly obese. She is not ill-appearing, toxic-appearing or diaphoretic.  ?   Comments: Appears tired  ?HENT:  ?   Head: Normocephalic and atraumatic.  ?   Right Ear: External ear normal.  ?   Left Ear: External ear normal.  ?Eyes:  ?   General: No scleral icterus.    ?   Right eye: No discharge.     ?   Left eye: No discharge.  ?   Conjunctiva/sclera: Conjunctivae normal.  ?Cardiovascular:  ?   Rate and Rhythm: Normal rate and regular rhythm.  ?   Pulses: Normal pulses.  ?   Heart sounds: Normal heart sounds.  ?Pulmonary:  ?   Effort: Pulmonary effort is normal.  ?   Breath sounds: Normal breath sounds.  ?Skin: ?   General: Skin is warm and dry.  ?   Findings: Rash present.  ?   Comments: Rash to BL forearms, confluent areas of raised dry 1 mm papules with some excoriations  ?Neurological:  ?   Mental Status: She is alert. Mental status is at baseline.  ?   Gait: Gait normal.  ?Psychiatric:     ?   Attention and Perception: Attention normal.     ?   Mood and Affect: Mood and affect normal.     ?   Speech: Speech normal.     ?   Behavior: Behavior normal. Behavior is cooperative.  ?  ? ?Results for orders placed or performed in visit on 08/18/21  ?HM PAP SMEAR   ?Result Value Ref Range  ? HM Pap smear Negative for intraephithelial lesion or malignancy   ? ? ?   ?Assessment & Plan:  ? ? ?1. Hypothyroidism, unspecified type ?Several sx consistent with chemical hypothyroid, very small dose increase ?F/up TSH in 6-8 weeks ?- TSH ?- levothyroxine (SYNTHROID) 75 MCG tablet; TAKE 1 TABLET BY MOUTH BEFORE BREAKFAST  Dispense: 90 tablet; Refill: 1 ? ?2. Rash and nonspecific skin  eruption ?To b/l arms and anterior thigh - appears consistent with keratosis pilaris - reviewed and discussed tx - including increased hydration/moisturizing with creams and vaseline, then can add OTC lactic acid or salicylic acid ?Does not appear consistent with hives or a contact dermatitis - may be related to dry skin/atopic dermatitis  ? ?3. Other fatigue ?Likely multifactorial ?Increasing thyroid med ?Will monitor ? ?4. Seizures (HCC) ?She has not gotten into neuro yet  - despite referrals and encouraging [facility] to go [facility] and [facility] - we may have not helped her adequately connect with available resources - will put in CCM - SW referral to assist ?Also encouraged her contacting [facility] and [facility]. ?[facility] may have program but I cannot find referral so requesting help from [employer] - SW CCM ?- AMB Referral to [facility] Coordinaton ? ?5. Chronic intractable headache, unspecified headache type ?Improved about 1 week after starting amitriptyline - continue bedtime dose 25 mg - pt understands not safe in pregnancy - she is not currently trying to conceive with OBGYN - pcos/infertility - advised to discuss with obgyn prior to attempting to conceive again ?- AMB Referral to [facility] Coordinaton ? ?6. PCOS (polycystic ovarian syndrome) ?Per OBGYN - currently on metformin ?We will do f/up labs with next appt ? ?7. Class 3 severe obesity with serious comorbidity and body mass index (BMI) of 40.0 to 44.9 in adult, unspecified obesity type (HCC) ?Weight has increased over  the past 6-9 months - multiple chronic sx and health conditions are likely contributory ?Will monitor weights ?Thyroid med dose increased ?On metformin ? ?8. Does not have health insurance ?- AMB Referral t

## 2021-09-13 ENCOUNTER — Telehealth: Payer: Self-pay | Admitting: *Deleted

## 2021-09-13 ENCOUNTER — Telehealth: Payer: Self-pay

## 2021-09-13 NOTE — Telephone Encounter (Signed)
?  Care Management  ? ?Follow Up Note ? ? ?09/13/2021 ?Name: Mercedes Koch MRN: [id number] DOB: 01/30/1991 ? ? ?Referred by: Danelle Berry, PA-C ?Reason for referral : No chief complaint on file. ? ?Successful contact was made with the patient to discuss care management and care coordination services. Patient requested that the appointment be rescheduled for 09/14/21 at 9am. ? ?Follow Up Plan: Telephone follow up appointment with care management team member scheduled for:09/14/21 ? ?[name], LCSW ?Clinical Social Worker  ?[facility] Care Management ?[phone] ? ? ?

## 2021-09-14 ENCOUNTER — Telehealth: Payer: Self-pay | Admitting: *Deleted

## 2021-09-14 ENCOUNTER — Telehealth: Payer: Self-pay

## 2021-09-14 NOTE — Telephone Encounter (Signed)
?  Care Management  ? ?Follow Up Note ? ? ?09/14/2021 ?Name: Mercedes Koch MRN: [id number] DOB: 31-Oct-1990 ? ? ?Referred by: Danelle Berry, PA-C ?Reason for referral : Care Coordination ? ? ?A second unsuccessful telephone outreach was attempted today. The patient was referred to the case management team for assistance with care management and care coordination.  ? ?Follow Up Plan: Telephone follow up appointment with care management team member be re-scheduled by careguide ? ?Chairty Toman, LCSW ?Clinical Social Worker  ?[facility] Care Management ?[phone] ? ? ?

## 2021-09-21 ENCOUNTER — Telehealth: Payer: Self-pay | Admitting: *Deleted

## 2021-09-21 NOTE — Chronic Care Management (AMB) (Signed)
  Care Coordination Note  09/21/2021 Name: Mercedes Koch MRN: [id number] DOB: Mar 24, 1991  Mercedes Koch is a 31 y.o. year old female who is a primary care patient of Sander Radon and is actively engaged with the care management team. I reached out to Mercedes Koch by phone today to assist with re-scheduling an initial visit with the Licensed Clinical Social Worker  Follow up plan: Unsuccessful telephone outreach attempt made. A HIPAA compliant phone message was left for the patient providing contact information and requesting a return call.   Burman Nieves, CCMA Care Guide, Embedded Care Coordination [facility]  Care Management  Direct Dial: [phone]

## 2021-09-24 NOTE — Chronic Care Management (AMB) (Signed)
  Care Coordination Note  09/24/2021 Name: [name] MRN: [id number] DOB: 06-20-1990  Mercedes Koch is a 31 y.o. year old female who is a primary care patient of Sander Radon and is actively engaged with the care management team. I reached out to Mercedes Koch by phone today to assist with re-scheduling an initial visit with the Licensed Clinical Social Worker  Follow up plan: Telephone appointment with care management team member scheduled for: 10/11/2021  Burman Nieves, CCMA Care Guide, Embedded Care Coordination [facility]  Care Management  Direct Dial: [phone]

## 2021-10-11 ENCOUNTER — Telehealth: Payer: Self-pay | Admitting: *Deleted

## 2021-10-11 NOTE — Telephone Encounter (Signed)
  Care Management   Follow Up Note   10/11/2021 Name: Mercedes Koch MRN: [id number] DOB: 12/09/1990   Referred by: Danelle Berry, PA-C Reason for referral : Care Coordination   Third unsuccessful telephone outreach was attempted today. The patient was referred to the case management team for assistance with care management and care coordination. The patient's primary care provider has been notified of our unsuccessful attempts to make or maintain contact with the patient. The care management team is pleased to engage with this patient at any time in the future should he/she be interested in assistance from the care management team.   Follow Up Plan: We have been unable to make contact with the patient for follow up. The care management team is available to follow up with the patient after provider conversation with the patient regarding recommendation for care management engagement and subsequent re-referral to the care management team.   Verna Czech, LCSW Clinical Social Worker  [facility] Care Management [phone]

## 2022-01-17 ENCOUNTER — Ambulatory Visit: Payer: Self-pay | Admitting: Family Medicine

## 2022-03-06 ENCOUNTER — Telehealth: Payer: Self-pay | Admitting: Family

## 2022-03-06 DIAGNOSIS — J209 Acute bronchitis, unspecified: Secondary | ICD-10-CM

## 2022-03-06 MED ORDER — PREDNISONE 10 MG (21) PO TBPK: ORAL_TABLET | ORAL | 0 refills | Status: DC

## 2022-03-06 MED ORDER — BENZONATATE 100 MG PO CAPS
100.0000 mg | ORAL_CAPSULE | Freq: Three times a day (TID) | ORAL | 0 refills | Status: DC | PRN
Start: 2022-03-06 — End: 2022-05-10

## 2022-03-06 NOTE — Progress Notes (Signed)
Virtual Visit Consent   Mercedes Koch, you are scheduled for a virtual visit with a [facility] provider today. Just as with appointments in the office, your consent must be obtained to participate. Your consent will be active for this visit and any virtual visit you may have with one of our providers in the next 365 days. If you have a MyChart account, a copy of this consent can be sent to you electronically.  As this is a virtual visit, video technology does not allow for your provider to perform a traditional examination. This may limit your provider's ability to fully assess your condition. If your provider identifies any concerns that need to be evaluated in person or the need to arrange testing (such as labs, EKG, etc.), we will make arrangements to do so. Although advances in technology are sophisticated, we cannot ensure that it will always work on either your end or our end. If the connection with a video visit is poor, the visit may have to be switched to a telephone visit. With either a video or telephone visit, we are not always able to ensure that we have a secure connection.  By engaging in this virtual visit, you consent to the provision of healthcare and authorize for your insurance to be billed (if applicable) for the services provided during this visit. Depending on your insurance coverage, you may receive a charge related to this service.  I need to obtain your verbal consent now. Are you willing to proceed with your visit today? Mercedes Koch has provided verbal consent on 03/06/2022 for a virtual visit (video or telephone). Evelina Dun, FNP  Date: 03/06/2022 4:36 PM  Virtual Visit via Video Note   I, Evelina Dun, connected with  Mercedes Koch  ([id number], 11/06/1990) on 03/06/22 at  4:30 PM EDT by a video-enabled telemedicine application and verified that I am speaking with the correct person using two identifiers.  Location: Patient: Virtual Visit Location  Patient: Other: work Provider: Ecologist: Home Office   I discussed the limitations of evaluation and management by telemedicine and the availability of in person appointments. The patient expressed understanding and agreed to proceed.    History of Present Illness: Mercedes Koch is a 31 y.o. who identifies as a female who was assigned female at birth, and is being seen today for cough.  HPI: Cough This is a new problem. The current episode started 1 to 4 weeks ago. The problem has been gradually worsening. The problem occurs every few minutes. The cough is Non-productive. Pertinent negatives include no chills, ear congestion, ear pain, fever, headaches, myalgias, nasal congestion, postnasal drip, shortness of breath or wheezing. She has tried rest for the symptoms. The treatment provided mild relief.    Problems:  Patient Active Problem List   Diagnosis Date Noted   Vitamin D deficiency 09/09/2019   Hepatic steatosis 09/09/2019   Headache, unspecified 08/17/2019   Epilepsy ([place])    Hypothyroidism    Hypertension 24/40/1027   Metabolic syndrome 25/36/6440   PCOS (polycystic ovarian syndrome) 06/10/2019   Class 3 severe obesity with serious comorbidity and body mass index (BMI) of 40.0 to 44.9 in adult [facility]) 06/10/2019    Allergies: No Known Allergies Medications:  Current Outpatient Medications:    benzonatate (TESSALON PERLES) 100 MG capsule, Take 1 capsule (100 mg total) by mouth 3 (three) times daily as needed., Disp: 20 capsule, Rfl: 0   predniSONE (STERAPRED UNI-PAK 21 TAB) 10 MG (  21) TBPK tablet, Use as directed, Disp: 21 tablet, Rfl: 0   amitriptyline (ELAVIL) 25 MG tablet, Take 1 tablet (25 mg total) by mouth at bedtime., Disp: 90 tablet, Rfl: 1   levothyroxine (SYNTHROID) 75 MCG tablet, TAKE 1 TABLET BY MOUTH BEFORE BREAKFAST, Disp: 90 tablet, Rfl: 1   metFORMIN (GLUCOPHAGE-XR) 500 MG 24 hr tablet, Take 500 mg by mouth daily., Disp: , Rfl:     SUMAtriptan (IMITREX) 25 MG tablet, Take 25 to 50 mg PO at onset of headache, may repeat dose after 2 hours if HA persists.  Maximum daily dose 200 mg, Disp: 20 tablet, Rfl: 1  Observations/Objective: Patient is well-developed, well-nourished in no acute distress.  Head is normocephalic, atraumatic.  No labored breathing.  Speech is clear and coherent with logical content.  Patient is alert and oriented at baseline.  Dry nonproductive cough  Assessment and Plan: 1. Acute bronchitis, unspecified organism - predniSONE (STERAPRED UNI-PAK 21 TAB) 10 MG (21) TBPK tablet; Use as directed  Dispense: 21 tablet; Refill: 0 - benzonatate (TESSALON PERLES) 100 MG capsule; Take 1 capsule (100 mg total) by mouth 3 (three) times daily as needed.  Dispense: 20 capsule; Refill: 0  - Take meds as prescribed - Use a cool mist humidifier  -Use saline nose sprays frequently -Force fluids -For any cough or congestion  Use plain Mucinex- regular strength or max strength is fine -For fever or aces or pains- take tylenol or ibuprofen. -Throat lozenges if help -Follow up if symptoms worsen or do not improve   Follow Up Instructions: I discussed the assessment and treatment plan with the patient. The patient was provided an opportunity to ask questions and all were answered. The patient agreed with the plan and demonstrated an understanding of the instructions.  A copy of instructions were sent to the patient via MyChart unless otherwise noted below.     The patient was advised to call back or seek an in-person evaluation if the symptoms worsen or if the condition fails to improve as anticipated.  Time:  I spent 11 minutes with the patient via telehealth technology discussing the above problems/concerns.    Jannifer Rodney, FNP

## 2022-03-06 NOTE — Patient Instructions (Signed)

## 2022-04-28 ENCOUNTER — Emergency Department (HOSPITAL_COMMUNITY): Payer: Self-pay

## 2022-04-28 ENCOUNTER — Other Ambulatory Visit: Payer: Self-pay

## 2022-04-28 ENCOUNTER — Encounter (HOSPITAL_COMMUNITY): Payer: Self-pay | Admitting: Emergency Medicine

## 2022-04-28 ENCOUNTER — Emergency Department (HOSPITAL_COMMUNITY)
Admission: EM | Admit: 2022-04-28 | Discharge: 2022-04-28 | Disposition: A | Discharge status: Home / Self Care | Payer: Self-pay | Attending: Emergency Medicine | Admitting: Emergency Medicine

## 2022-04-28 DIAGNOSIS — K529 Noninfective gastroenteritis and colitis, unspecified: Secondary | ICD-10-CM | POA: Insufficient documentation

## 2022-04-28 DIAGNOSIS — R110 Nausea: Secondary | ICD-10-CM

## 2022-04-28 DIAGNOSIS — Z79890 Hormone replacement therapy (postmenopausal): Secondary | ICD-10-CM | POA: Insufficient documentation

## 2022-04-28 DIAGNOSIS — R1010 Upper abdominal pain, unspecified: Secondary | ICD-10-CM

## 2022-04-28 DIAGNOSIS — E039 Hypothyroidism, unspecified: Secondary | ICD-10-CM | POA: Insufficient documentation

## 2022-04-28 DIAGNOSIS — I10 Essential (primary) hypertension: Secondary | ICD-10-CM | POA: Insufficient documentation

## 2022-04-28 LAB — URINALYSIS, ROUTINE W REFLEX MICROSCOPIC
Bilirubin Urine: NEGATIVE
Glucose, UA: NEGATIVE mg/dL
Ketones, ur: NEGATIVE mg/dL
Nitrite: NEGATIVE
Protein, ur: >=300 mg/dL — AB
Specific Gravity, Urine: 1.02 (ref 1.005–1.030)
pH: 7 (ref 5.0–8.0)

## 2022-04-28 LAB — COMPREHENSIVE METABOLIC PANEL
ALT: 26 U/L (ref 0–44)
AST: 23 U/L (ref 15–41)
Albumin: 4.1 g/dL (ref 3.5–5.0)
Alkaline Phosphatase: 55 U/L (ref 38–126)
Anion gap: 9 (ref 5–15)
BUN: 14 mg/dL (ref 6–20)
CO2: 24 mmol/L (ref 22–32)
Calcium: 9.2 mg/dL (ref 8.9–10.3)
Chloride: 105 mmol/L (ref 98–111)
Creatinine, Ser: 0.75 mg/dL (ref 0.44–1.00)
GFR, Estimated: >60 mL/min (ref >60)
Glucose, Bld: 145 mg/dL — ABNORMAL HIGH (ref 70–99)
Potassium: 3.7 mmol/L (ref 3.5–5.1)
Sodium: 138 mmol/L (ref 135–145)
Total Bilirubin: 0.4 mg/dL (ref 0.3–1.2)
Total Protein: 7.5 g/dL (ref 6.5–8.1)

## 2022-04-28 LAB — LIPASE, BLOOD: Lipase: 27 U/L (ref 11–51)

## 2022-04-28 LAB — CBC
HCT: 42.7 % (ref 36.0–46.0)
Hemoglobin: 13.5 g/dL (ref 12.0–15.0)
MCH: 28.3 pg (ref 26.0–34.0)
MCHC: 31.6 g/dL (ref 30.0–36.0)
MCV: 89.5 fL (ref 80.0–100.0)
Platelets: 313 10*3/uL (ref 150–400)
RBC: 4.77 MIL/uL (ref 3.87–5.11)
RDW: 13.2 % (ref 11.5–15.5)
WBC: 10.9 10*3/uL — ABNORMAL HIGH (ref 4.0–10.5)
nRBC: 0 % (ref 0.0–0.2)

## 2022-04-28 LAB — PREGNANCY, URINE: Preg Test, Ur: NEGATIVE

## 2022-04-28 MED ORDER — IOHEXOL 300 MG/ML  SOLN
100.0000 mL | Freq: Once | INTRAMUSCULAR | Status: AC | PRN
  Administered 2022-04-28: 100 mL via INTRAVENOUS

## 2022-04-28 MED ORDER — ONDANSETRON 4 MG PO TBDP
4.0000 mg | ORAL_TABLET | Freq: Once | ORAL | Status: AC
  Administered 2022-04-28: 4 mg via ORAL

## 2022-04-28 NOTE — ED Provider Notes (Signed)
[facility] EMERGENCY DEPARTMENT Provider Note   CSN: [id number] Arrival date & time: 04/28/22  1141     History  Chief Complaint  Patient presents with   Abdominal Pain    Mercedes Koch is a 31 y.o. female with history of epilepsy, hypothyroidism, HTN, PCOS, SBO in 2021 who presents to the emergency department complaining of sharp upper abdominal pain starting around 5 am this morning. Associated nausea. Reports prior small bowel obstruction was related to her adhesions from her c-section. Reports symptoms today feel very similar to prior episode. No vomiting, but patient is concerned about progression. No PO intake since yesterday at lunch. Normal BM yesterday. Doesn't think she has passed gas today.    Abdominal Pain Associated symptoms: nausea   Associated symptoms: no diarrhea, no fever and no vomiting        Home Medications Prior to Admission medications   Medication Sig Start Date End Date Taking? Authorizing Provider  amitriptyline (ELAVIL) 25 MG tablet Take 1 tablet (25 mg total) by mouth at bedtime. 07/27/21   Danelle Berry, PA-C  benzonatate (TESSALON PERLES) 100 MG capsule Take 1 capsule (100 mg total) by mouth 3 (three) times daily as needed. 03/06/22   Junie Spencer, FNP  levothyroxine (SYNTHROID) 75 MCG tablet TAKE 1 TABLET BY MOUTH BEFORE BREAKFAST 09/07/21   Danelle Berry, PA-C  metFORMIN (GLUCOPHAGE-XR) 500 MG 24 hr tablet Take 500 mg by mouth daily. 08/31/21   [provider]  predniSONE (STERAPRED UNI-PAK 21 TAB) 10 MG (21) TBPK tablet Use as directed 03/06/22   Jannifer Rodney A, FNP  SUMAtriptan (IMITREX) 25 MG tablet Take 25 to 50 mg PO at onset of headache, may repeat dose after 2 hours if HA persists.  Maximum daily dose 200 mg 07/29/21   Danelle Berry, PA-C      Allergies    Patient has no known allergies.    Review of Systems   Review of Systems  Constitutional:  Negative for fever.  Gastrointestinal:  Positive for abdominal pain and  nausea. Negative for diarrhea and vomiting.  All other systems reviewed and are negative.   Physical Exam Updated Vital Signs BP (!) 127/91   Pulse 73   Temp 98.5 F (36.9 C) (Oral)   Resp 18   LMP 04/21/2022   SpO2 96%  Physical Exam Vitals and nursing note reviewed.  Constitutional:      Appearance: Normal appearance.  HENT:     Head: Normocephalic and atraumatic.  Eyes:     Conjunctiva/sclera: Conjunctivae normal.  Cardiovascular:     Rate and Rhythm: Normal rate and regular rhythm.  Pulmonary:     Effort: Pulmonary effort is normal. No respiratory distress.     Breath sounds: Normal breath sounds.  Abdominal:     General: There is no distension.     Palpations: Abdomen is soft.     Tenderness: There is no abdominal tenderness.  Skin:    General: Skin is warm and dry.  Neurological:     General: No focal deficit present.     Mental Status: She is alert.     ED Results / Procedures / Treatments   Labs (all labs ordered are listed, but only abnormal results are displayed) Labs Reviewed  COMPREHENSIVE METABOLIC PANEL - Abnormal; Notable for the following components:      Result Value   Glucose, Bld 145 (*)    All other components within normal limits  CBC - Abnormal; Notable for the following  components:   WBC 10.9 (*)    All other components within normal limits  URINALYSIS, ROUTINE W REFLEX MICROSCOPIC - Abnormal; Notable for the following components:   APPearance CLOUDY (*)    Hgb urine dipstick SMALL (*)    Protein, ur >=300 (*)    Leukocytes,Ua TRACE (*)    Bacteria, UA RARE (*)    All other components within normal limits  LIPASE, BLOOD  PREGNANCY, URINE    EKG None  Radiology CT ABDOMEN PELVIS W CONTRAST  Result Date: 04/28/2022 CLINICAL DATA:  Sharp upper abdominal pain since this a.m.  Nausea. EXAM: CT ABDOMEN AND PELVIS WITH CONTRAST TECHNIQUE: Multidetector CT imaging of the abdomen and pelvis was performed using the standard protocol  following bolus administration of intravenous contrast. RADIATION DOSE REDUCTION: This exam was performed according to the departmental dose-optimization program which includes automated exposure control, adjustment of the mA and/or kV according to patient size and/or use of iterative reconstruction technique. CONTRAST:  172mL OMNIPAQUE IOHEXOL 300 MG/ML  SOLN COMPARISON:  CT August 10, 2019 FINDINGS: Lower chest: No acute abnormality. Hepatobiliary: 12 mm cyst in the anterior left lobe of the liver. Gallbladder is unremarkable. No biliary ductal dilation. Pancreas: No pancreatic ductal dilation or evidence of acute inflammation. Spleen: No splenomegaly. Adrenals/Urinary Tract: Bilateral adrenal glands appear normal. No hydronephrosis. Kidneys demonstrate symmetric enhancement. Right lower pole renal cyst is considered benign and requires no independent imaging follow-up. Stomach/Bowel: No radiopaque enteric contrast material was administered. Stomach is minimally distended limiting evaluation. No pathologic dilation of small or large bowel. Inflamed loops of small bowel predominantly in the right hemiabdomen with mesenteric edema. Normal appendix. No suspicious colonic wall thickening or masslike lesions. Vascular/Lymphatic: Normal caliber abdominal aorta. No pathologically enlarged abdominal or pelvic lymph nodes. Reproductive: Uterus and bilateral adnexa are unremarkable in CT appearance for reproductive age female. Other: Trace pelvic free fluid. Musculoskeletal: No acute osseous abnormality. IMPRESSION: 1. Inflamed loops of small bowel predominantly in the right hemiabdomen with mesenteric edema, most consistent with an infectious or inflammatory enteritis. 2. Trace pelvic free fluid, likely reactive. Electronically Signed   By: Dahlia Bailiff M.D.   On: 04/28/2022 18:21    Procedures Procedures    Medications Ordered in ED Medications  ondansetron (ZOFRAN-ODT) disintegrating tablet 4 mg (4 mg Oral Given  04/28/22 1349)  iohexol (OMNIPAQUE) 300 MG/ML solution 100 mL (100 mLs Intravenous Contrast Given 04/28/22 1804)    ED Course/ Medical Decision Making/ A&P                           Medical Decision Making Amount and/or Complexity of Data Reviewed Labs: ordered. Radiology: ordered.  Risk Prescription drug management.  This patient is a 31 y.o. female  who presents to the ED for concern of abdominal pain since this morning.   Differential diagnoses prior to evaluation: The emergent differential diagnosis includes, but is not limited to,  AAA, mesenteric ischemia, appendicitis, diverticulitis, DKA, gastroenteritis, nephrolithiasis, pancreatitis, constipation, UTI, bowel obstruction, biliary disease, IBD, PUD, hepatitis, ectopic pregnancy, ovarian torsion, PID. This is not an exhaustive differential.   Past Medical History / Co-morbidities: epilepsy, hypothyroidism, HTN, PCOS, SBO in 2021  Additional history: Chart reviewed. Pertinent results include: Pt admitted for SBO in April 2021, treated non-surgically.   Physical Exam: Physical exam performed. The pertinent findings include: Mildly hypertensive, otherwise normal vital signs. Abdomen soft, non-tender. Nausea improved with zofran.   Lab Tests/Imaging studies: I personally interpreted  labs/imaging and the pertinent results include:  mild leukocytosis of 10.9, CMP unremarkable. Normal lipase. Urinalysis with >300 protein, negative for infection.  CT abdomen/pelvis with inflamed loops of small bowel, consistent with infectious or inflammatory enteritis. I agree with the radiologist interpretation.  Medications: I ordered medication including zofran for nausea.  I have reviewed the patients home medicines and have made adjustments as needed.   Disposition: After consideration of the diagnostic results and the patients response to treatment, I feel that emergency department workup does not suggest an emergent condition requiring  admission or immediate intervention beyond what has been performed at this time. The plan is: discharge to home with symptomatic management of enteritis. Will encourage good fluid intake. Offered zofran and patient declined. Suspect likely viral etiology, low concern for acute bacterial process. The patient is safe for discharge and has been instructed to return immediately for worsening symptoms, change in symptoms or any other concerns.  Final Clinical Impression(s) / ED Diagnoses Final diagnoses:  Pain of upper abdomen  Enteritis  Nausea    Rx / DC Orders ED Discharge Orders     None      Portions of this report may have been transcribed using voice recognition software. Every effort was made to ensure accuracy; however, inadvertent computerized transcription errors may be present.    Estill Cotta 04/28/22 1837    Isla Pence, MD 04/28/22 [phone]

## 2022-04-28 NOTE — ED Triage Notes (Signed)
Pt c/o sudden sharp upper abd pain started around 5 am with nausea. Denies v/d. States the last time this happened "my c section scar was intertwined with my intestines". Color wnl. Mm wet. Nad at this time in triage. Abd soft. Tender to upper mid area.

## 2022-04-28 NOTE — Discharge Instructions (Addendum)
You were seen in the emergency department for abdominal pain and nausea.  Your CT scan did not show any blockage, but did show some inflammation of part of your small bowel. I think you likely have a big of a stomach bug.   I recommend drinking plenty of fluids. You can take tylenol or ibuprofen as needed for pain.  Continue to monitor how you're doing and return to the ER for new or worsening symptoms.

## 2022-04-28 NOTE — ED Provider Triage Note (Signed)
Emergency Medicine Provider Triage Evaluation Note  Mercedes Koch , a 31 y.o. female  was evaluated in triage.  Pt complains of  who presents to the emergency department with complaints of abdominal pain.  She states that she has a history of small bowel obstruction secondary to adhesions from her C-section.  She states that her symptoms today feel very similar with abdominal pain, nausea, poor p.o. intake.  No p.o. intake since yesterday at lunch.  No vomiting yet but is worried about progression and development of abdominal pain...  Review of Systems  Positive: Abdominal pain, nausea Negative: Fevers chills  Physical Exam  BP (!) 142/103 (BP Location: Right Arm)   Pulse 96   Temp 98 F (36.7 C) (Oral)   Resp 20   LMP 04/21/2022   SpO2 96%  Gen:   Awake, no distress   Resp:  Normal effort  MSK:   Moves extremities without difficulty  Other:  Generalized abdominal tenderness  Medical Decision Making  Medically screening exam initiated at 1:27 PM.  Appropriate orders placed.  [name] was informed that the remainder of the evaluation will be completed by another provider, this initial triage assessment does not replace that evaluation, and the importance of remaining in the ED until their evaluation is complete.     Glyn Ade, MD 04/28/22 1418

## 2022-05-06 ENCOUNTER — Encounter: Payer: Self-pay | Admitting: Family Medicine

## 2022-05-10 ENCOUNTER — Ambulatory Visit (INDEPENDENT_AMBULATORY_CARE_PROVIDER_SITE_OTHER): Payer: Self-pay | Admitting: Family Medicine

## 2022-05-10 ENCOUNTER — Encounter: Payer: Self-pay | Admitting: Family Medicine

## 2022-05-10 VITALS — BP 126/82 | HR 84 | Temp 97.7°F | Resp 16 | Ht 66.0 in | Wt 272.0 lb

## 2022-05-10 DIAGNOSIS — E039 Hypothyroidism, unspecified: Secondary | ICD-10-CM

## 2022-05-10 DIAGNOSIS — K7689 Other specified diseases of liver: Secondary | ICD-10-CM | POA: Insufficient documentation

## 2022-05-10 DIAGNOSIS — Z5181 Encounter for therapeutic drug level monitoring: Secondary | ICD-10-CM

## 2022-05-10 DIAGNOSIS — E559 Vitamin D deficiency, unspecified: Secondary | ICD-10-CM

## 2022-05-10 DIAGNOSIS — Z0000 Encounter for general adult medical examination without abnormal findings: Secondary | ICD-10-CM

## 2022-05-10 DIAGNOSIS — R7303 Prediabetes: Secondary | ICD-10-CM

## 2022-05-10 DIAGNOSIS — E8881 Metabolic syndrome: Secondary | ICD-10-CM

## 2022-05-10 DIAGNOSIS — Z09 Encounter for follow-up examination after completed treatment for conditions other than malignant neoplasm: Secondary | ICD-10-CM

## 2022-05-10 MED ORDER — LEVOTHYROXINE SODIUM 75 MCG PO TABS: ORAL_TABLET | ORAL | 0 refills | Status: DC

## 2022-05-10 NOTE — Progress Notes (Signed)
Patient ID: Mercedes Koch, female    DOB: 26-May-1990, 32 y.o.   MRN: [id number]  PCP: Danelle Berry, PA-C  Chief Complaint  Patient presents with   Follow-up    ER follow up and discuss imaging. Pt states feeling better    Subjective:   Mercedes Koch is a 32 y.o. female, presents to clinic with CC of the following:  HPI  ER f/up for abd pain with hx of SBO Severe Abd pain sent her to the ED - she had pain that woke her up at 3am, pain did not subside, associated nausea CT scan showed inflamed small bowel and mesentery w/o obstruction She reviewed her results and was concerned about liver cyst noted 12 mm cyst - not mentioned in impression and no description or noted need for f/up Reviewed prior CT abd/pelvis  Prior incidental cysts/nodules had resolved (kidney) small 6 mm liver cyst was there - possibly the same She has no abd pain currently Denies N, V, D, fever, chills, bloating, fullness, weight changes  Hypothyroid on levothyroxine 75 mcg, last labs in April Hx of vit d deficiency that sig improved with supplementation, last was near normal (~28) Hx of prediabetes last A1C showed improvement to normal range She is cash pay, has not done a physical in a while due to cost    Patient Active Problem List   Diagnosis Date Noted   Vitamin D deficiency 09/09/2019   Hepatic steatosis 09/09/2019   Headache, unspecified 08/17/2019   Epilepsy (HCC)    Hypothyroidism    Hypertension 06/10/2019   Metabolic syndrome 06/10/2019   PCOS (polycystic ovarian syndrome) 06/10/2019   Class 3 severe obesity with serious comorbidity and body mass index (BMI) of 40.0 to 44.9 in adult (HCC) 06/10/2019      Current Outpatient Medications:    amitriptyline (ELAVIL) 25 MG tablet, Take 1 tablet (25 mg total) by mouth at bedtime., Disp: 90 tablet, Rfl: 1   levothyroxine (SYNTHROID) 75 MCG tablet, TAKE 1 TABLET BY MOUTH BEFORE BREAKFAST, Disp: 90 tablet, Rfl: 1   metFORMIN  (GLUCOPHAGE-XR) 500 MG 24 hr tablet, Take 500 mg by mouth daily., Disp: , Rfl:    SUMAtriptan (IMITREX) 25 MG tablet, Take 25 to 50 mg PO at onset of headache, may repeat dose after 2 hours if HA persists.  Maximum daily dose 200 mg, Disp: 20 tablet, Rfl: 1   benzonatate (TESSALON PERLES) 100 MG capsule, Take 1 capsule (100 mg total) by mouth 3 (three) times daily as needed. (Patient not taking: Reported on 05/10/2022), Disp: 20 capsule, Rfl: 0   predniSONE (STERAPRED UNI-PAK 21 TAB) 10 MG (21) TBPK tablet, Use as directed (Patient not taking: Reported on 05/10/2022), Disp: 21 tablet, Rfl: 0   No Known Allergies   Social History   Tobacco Use   Smoking status: Never   Smokeless tobacco: Never  Vaping Use   Vaping Use: Never used  Substance Use Topics   Alcohol use: No   Drug use: No      Chart Review Today: I personally reviewed active problem list, medication list, allergies, family history, social history, health maintenance, notes from last encounter, lab results, imaging with the patient/caregiver today.   Review of Systems  Constitutional: Negative.   HENT: Negative.    Eyes: Negative.   Respiratory: Negative.    Cardiovascular: Negative.   Gastrointestinal: Negative.   Endocrine: Negative.   Genitourinary: Negative.   Musculoskeletal: Negative.   Skin: Negative.   Allergic/Immunologic:  Negative.   Neurological: Negative.   Hematological: Negative.   Psychiatric/Behavioral: Negative.    All other systems reviewed and are negative.      Objective:   Vitals:   05/10/22 0808  BP: 126/82  Pulse: 84  Resp: 16  Temp: 97.7 F (36.5 C)  TempSrc: Oral  SpO2: 97%  Weight: 272 lb (123.4 kg)  Height: 5\' 6"  (1.676 m)    Body mass index is 43.9 kg/m.  Physical Exam Vitals and nursing note reviewed.  Constitutional:      General: She is not in acute distress.    Appearance: Normal appearance. She is well-developed. She is obese. She is not ill-appearing,  toxic-appearing or diaphoretic.  HENT:     Head: Normocephalic and atraumatic.     Right Ear: External ear normal.     Left Ear: External ear normal.     Nose: Nose normal.  Eyes:     General:        Right eye: No discharge.        Left eye: No discharge.     Conjunctiva/sclera: Conjunctivae normal.  Neck:     Trachea: No tracheal deviation.  Cardiovascular:     Rate and Rhythm: Normal rate and regular rhythm.  Pulmonary:     Effort: Pulmonary effort is normal. No respiratory distress.     Breath sounds: No stridor.  Skin:    General: Skin is warm and dry.     Findings: No rash.  Neurological:     Mental Status: She is alert.     Motor: No abnormal muscle tone.     Coordination: Coordination normal.     Gait: Gait normal.  Psychiatric:        Mood and Affect: Mood normal.        Behavior: Behavior normal.      Results for orders placed or performed during the hospital encounter of 04/28/22  Lipase, blood  Result Value Ref Range   Lipase 27 11 - 51 U/L  Comprehensive metabolic panel  Result Value Ref Range   Sodium 138 135 - 145 mmol/L   Potassium 3.7 3.5 - 5.1 mmol/L   Chloride 105 98 - 111 mmol/L   CO2 24 22 - 32 mmol/L   Glucose, Bld 145 (H) 70 - 99 mg/dL   BUN 14 6 - 20 mg/dL   Creatinine, Ser 0.75 0.44 - 1.00 mg/dL   Calcium 9.2 8.9 - 10.3 mg/dL   Total Protein 7.5 6.5 - 8.1 g/dL   Albumin 4.1 3.5 - 5.0 g/dL   AST 23 15 - 41 U/L   ALT 26 0 - 44 U/L   Alkaline Phosphatase 55 38 - 126 U/L   Total Bilirubin 0.4 0.3 - 1.2 mg/dL   GFR, Estimated >60 >60 mL/min   Anion gap 9 5 - 15  CBC  Result Value Ref Range   WBC 10.9 (H) 4.0 - 10.5 K/uL   RBC 4.77 3.87 - 5.11 MIL/uL   Hemoglobin 13.5 12.0 - 15.0 g/dL   HCT 42.7 36.0 - 46.0 %   MCV 89.5 80.0 - 100.0 fL   MCH 28.3 26.0 - 34.0 pg   MCHC 31.6 30.0 - 36.0 g/dL   RDW 13.2 11.5 - 15.5 %   Platelets 313 150 - 400 K/uL   nRBC 0.0 0.0 - 0.2 %  Urinalysis, Routine w reflex microscopic Urine, Clean Catch   Result Value Ref Range   Color, Urine YELLOW YELLOW   APPearance  CLOUDY (A) CLEAR   Specific Gravity, Urine 1.020 1.005 - 1.030   pH 7.0 5.0 - 8.0   Glucose, UA NEGATIVE NEGATIVE mg/dL   Hgb urine dipstick SMALL (A) NEGATIVE   Bilirubin Urine NEGATIVE NEGATIVE   Ketones, ur NEGATIVE NEGATIVE mg/dL   Protein, ur >=300 (A) NEGATIVE mg/dL   Nitrite NEGATIVE NEGATIVE   Leukocytes,Ua TRACE (A) NEGATIVE   RBC / HPF 0-5 0 - 5 RBC/hpf   WBC, UA 6-10 0 - 5 WBC/hpf   Bacteria, UA RARE (A) NONE SEEN   Squamous Epithelial / LPF 11-20 0 - 5   Mucus PRESENT    Amorphous Crystal PRESENT   Pregnancy, urine  Result Value Ref Range   Preg Test, Ur NEGATIVE NEGATIVE       Assessment & Plan:     ICD-10-CM   1. Liver cyst  K76.89 Comprehensive metabolic panel   12 mm left lobe - ER CT 04/28/2022, reviewed liver cyst algorithm and prior scans, asx, small size, no f/up imaging needed    2. Hypothyroidism, unspecified type  E03.9 levothyroxine (SYNTHROID) 75 MCG tablet    TSH    Comprehensive metabolic panel   continue 75 mcg daily, get labs done in the next 3 months and do f/up visit    3. Vitamin D deficiency  E55.9 Comprehensive metabolic panel   last labs were improved, continue daily supplement    4. Metabolic syndrome  X73.532 Comprehensive metabolic panel    5. Adult general medical exam  Z00.00 TSH    VITAMIN D 25 Hydroxy (Vit-D Deficiency, Fractures)    CBC with Differential/Platelet    Hemoglobin A1c    TSH    Comprehensive metabolic panel    CANCELED: Comprehensive metabolic panel    CANCELED: Comprehensive metabolic panel   labs for future CPE if pt is able to afford doing CPE and labs - encouraged her to call [place] billing for estimates/charity care    6. Encounter for medication monitoring  Z51.81 Hemoglobin A1c    TSH    Comprehensive metabolic panel    CANCELED: Comprehensive metabolic panel    7. Encounter for examination following treatment at hospital  Z09     ER labs/imaging reviewed, no lab abnormalities needing f/up right now, pt sx resolved    8. Prediabetes  R73.03 Comprehensive metabolic panel   recheck D9M annually      Return for 3 routine f/up or CPE up to pt .      Delsa Grana, PA-C 05/10/22 9:02 AM

## 2022-05-16 ENCOUNTER — Encounter: Payer: Self-pay | Admitting: Family Medicine

## 2022-05-16 DIAGNOSIS — E282 Polycystic ovarian syndrome: Secondary | ICD-10-CM

## 2022-05-16 DIAGNOSIS — E8881 Metabolic syndrome: Secondary | ICD-10-CM

## 2022-05-16 DIAGNOSIS — R7303 Prediabetes: Secondary | ICD-10-CM

## 2022-05-16 MED ORDER — METFORMIN HCL ER 500 MG PO TB24: 500.0000 mg | ORAL_TABLET | Freq: Every day | ORAL | 0 refills | Status: AC

## 2022-05-16 NOTE — Telephone Encounter (Signed)
Reviewed chart - last note from other clinic stated the following: Telephone Encounter - Georgie Chard, [place] - 05/16/2022 1:11 PM EST Formatting of this note might be different from the original. Denied, needs apt, last visit 07/21/2020. Electronically signed by Georgie Chard, FNP at 05/16/2022 1:11 PM EST  I just saw the pt, she did do labs a year ago and will need to complete the recently ordered labs Lab Results  Component Value Date   HGBA1C 5.5 05/13/2021     ICD-10-CM   1. Metabolic syndrome  R97.588 metFORMIN (GLUCOPHAGE-XR) 500 MG 24 hr tablet    2. PCOS (polycystic ovarian syndrome)  E28.2 metFORMIN (GLUCOPHAGE-XR) 500 MG 24 hr tablet    3. Pre-diabetes  R73.03 metFORMIN (GLUCOPHAGE-XR) 500 MG 24 hr tablet     Msg sent to pt that I will do one 3 month refill and she will have to complete prior labs ordered in order to get further refills  Delsa Grana, PA-C

## 2022-08-15 ENCOUNTER — Encounter: Payer: Self-pay | Admitting: Nurse Practitioner

## 2022-12-16 IMAGING — DX DG CHEST 2V
2 series · 2 of 2 positions shown · non-contrast
Comparison: August 10, 2019.

CLINICAL DATA: Chest pain.

EXAM:
CHEST - 2 VIEW

[chest pa]
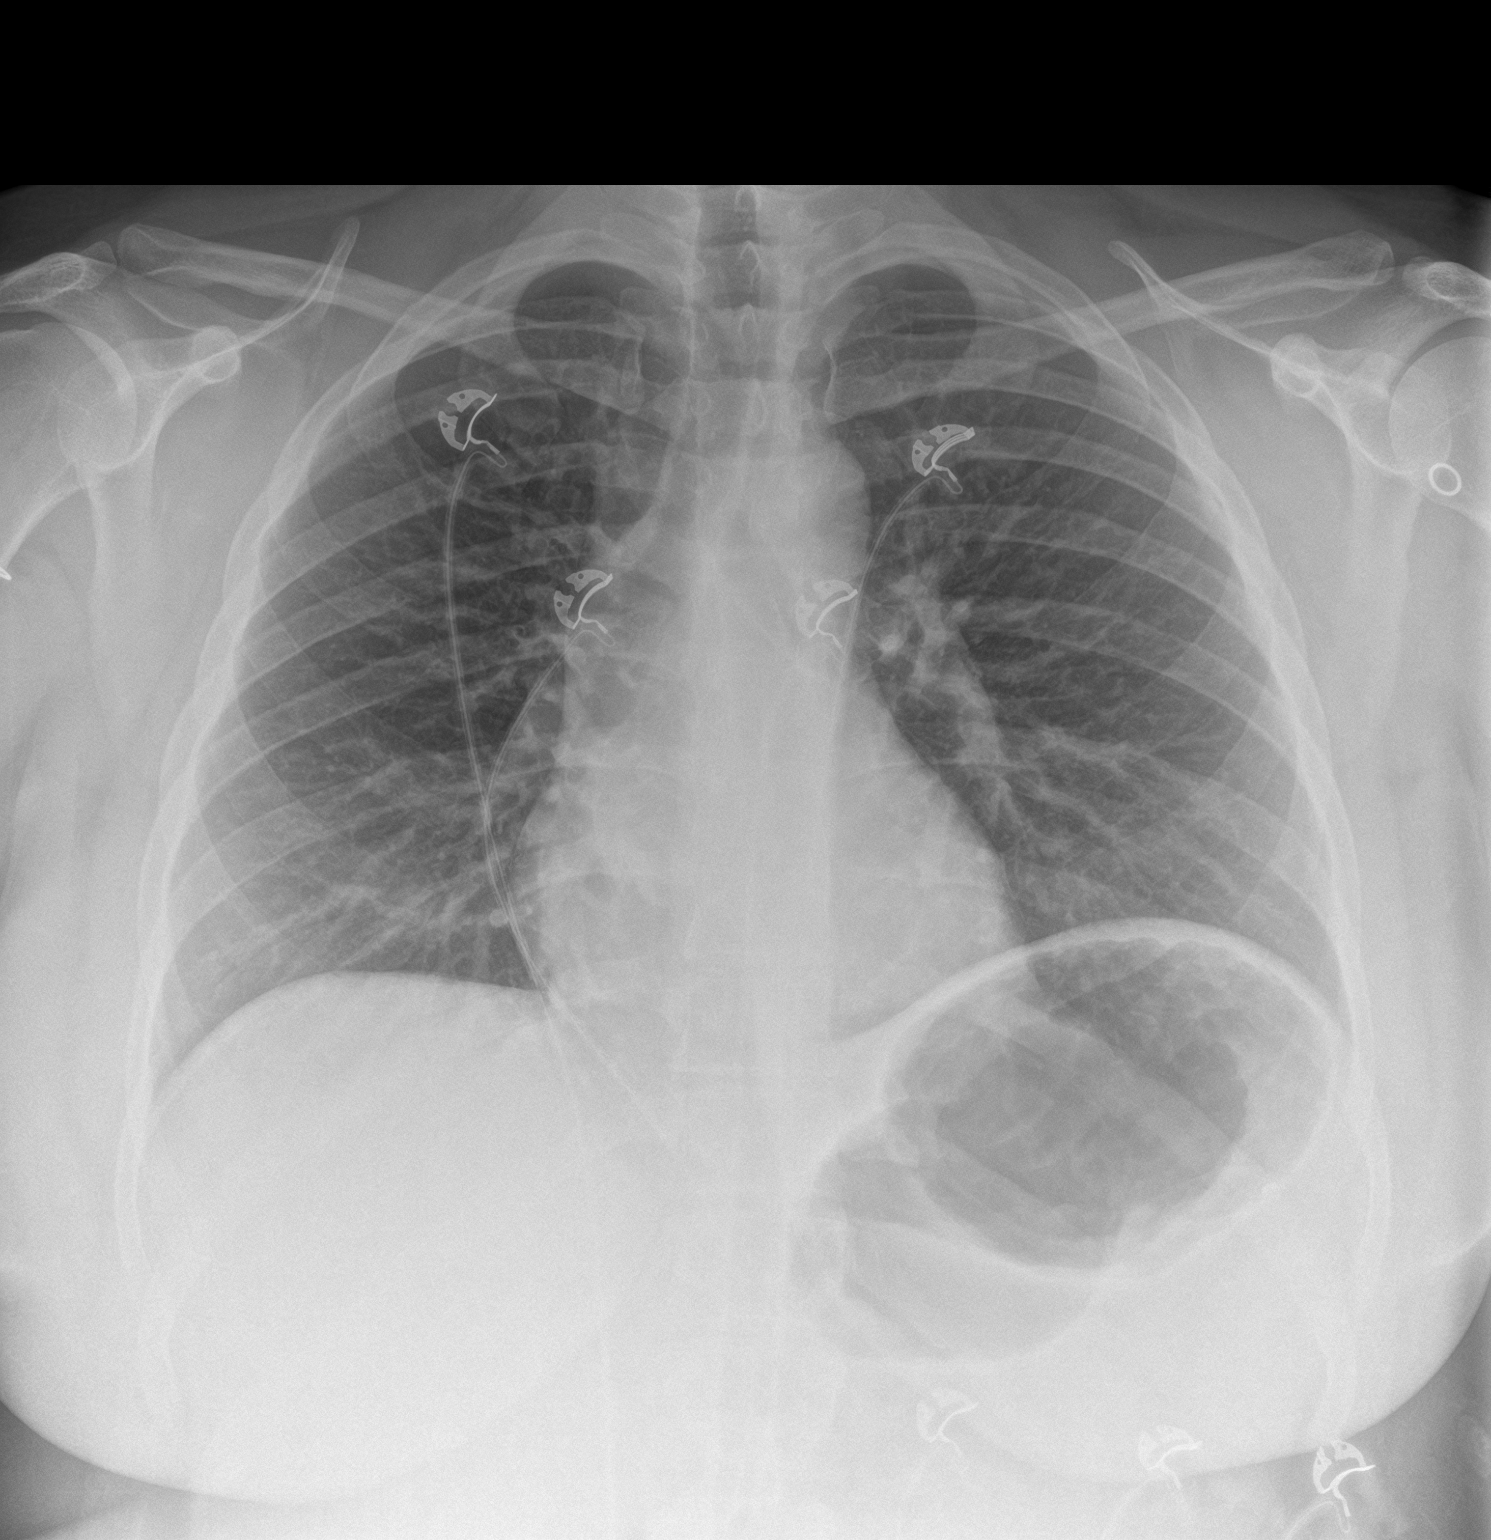

[chest lat]
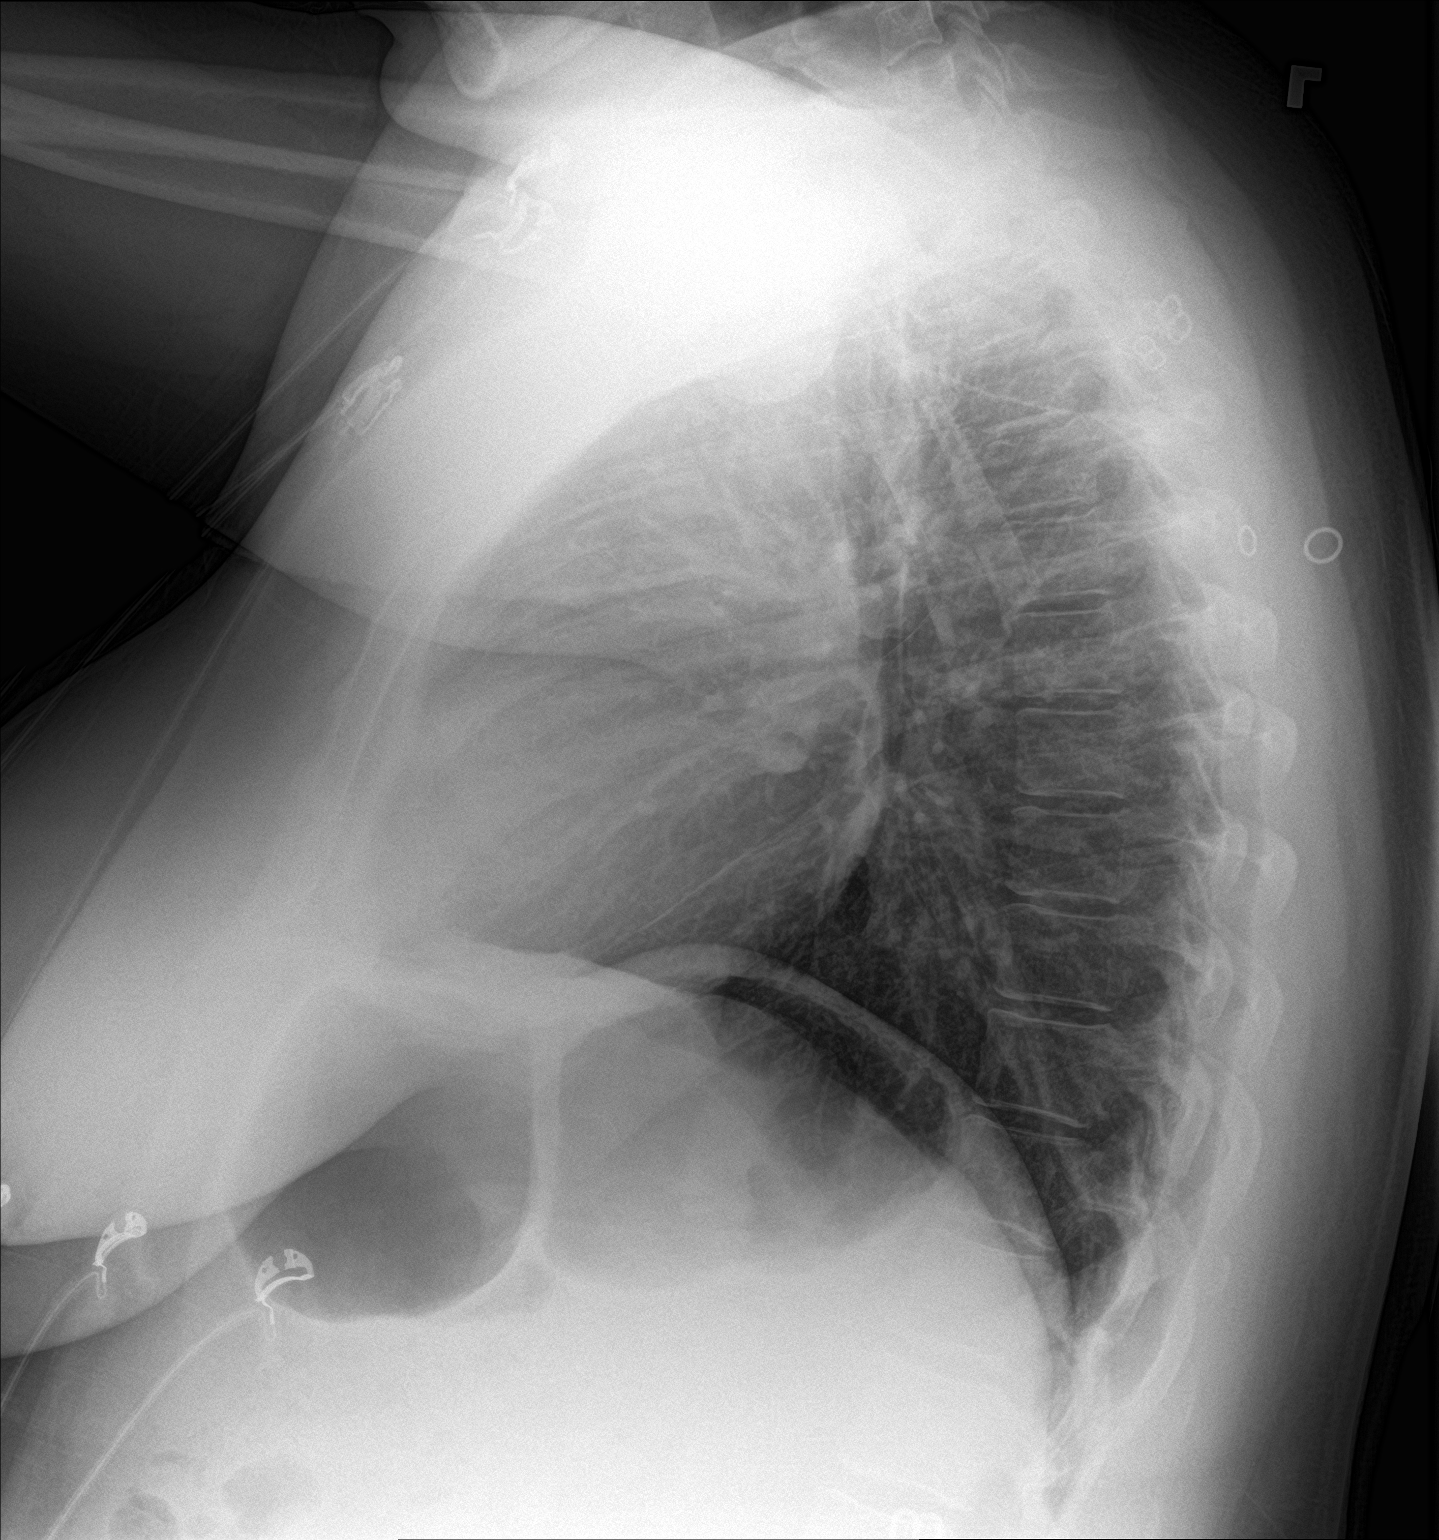

[2 of 2 positions shown; findings below may reference images not displayed]

FINDINGS: The heart size and mediastinal contours are within normal limits.
Both lungs are clear. The visualized skeletal structures are
unremarkable.
IMPRESSION: No active cardiopulmonary disease.

## 2022-12-28 ENCOUNTER — Other Ambulatory Visit: Payer: Self-pay | Admitting: Family Medicine

## 2022-12-28 DIAGNOSIS — E039 Hypothyroidism, unspecified: Secondary | ICD-10-CM

## 2022-12-28 DIAGNOSIS — E282 Polycystic ovarian syndrome: Secondary | ICD-10-CM

## 2022-12-28 DIAGNOSIS — E8881 Metabolic syndrome: Secondary | ICD-10-CM

## 2022-12-28 DIAGNOSIS — R7303 Prediabetes: Secondary | ICD-10-CM

## 2023-01-10 ENCOUNTER — Ambulatory Visit: Payer: Self-pay | Admitting: Family Medicine

## 2023-01-24 ENCOUNTER — Ambulatory Visit (INDEPENDENT_AMBULATORY_CARE_PROVIDER_SITE_OTHER): Payer: Self-pay | Admitting: Family Medicine

## 2023-01-24 DIAGNOSIS — E039 Hypothyroidism, unspecified: Secondary | ICD-10-CM

## 2023-01-24 DIAGNOSIS — Z5181 Encounter for therapeutic drug level monitoring: Secondary | ICD-10-CM

## 2023-01-24 DIAGNOSIS — I10 Essential (primary) hypertension: Secondary | ICD-10-CM

## 2023-01-24 DIAGNOSIS — Z91199 Patient's noncompliance with other medical treatment and regimen due to unspecified reason: Secondary | ICD-10-CM

## 2023-01-24 DIAGNOSIS — R7303 Prediabetes: Secondary | ICD-10-CM

## 2023-02-20 ENCOUNTER — Encounter: Payer: Self-pay | Admitting: Family Medicine

## 2023-03-22 ENCOUNTER — Encounter: Payer: Self-pay | Admitting: Family Medicine

## 2023-03-22 ENCOUNTER — Other Ambulatory Visit: Payer: Self-pay | Admitting: Family Medicine

## 2023-03-22 DIAGNOSIS — E282 Polycystic ovarian syndrome: Secondary | ICD-10-CM

## 2023-03-22 DIAGNOSIS — R7303 Prediabetes: Secondary | ICD-10-CM

## 2023-03-22 DIAGNOSIS — E8881 Metabolic syndrome: Secondary | ICD-10-CM

## 2023-03-22 DIAGNOSIS — E039 Hypothyroidism, unspecified: Secondary | ICD-10-CM

## 2023-04-10 ENCOUNTER — Telehealth: Payer: Self-pay | Admitting: Physician Assistant

## 2023-04-25 ENCOUNTER — Other Ambulatory Visit: Payer: Self-pay | Admitting: Family Medicine

## 2023-04-25 DIAGNOSIS — E039 Hypothyroidism, unspecified: Secondary | ICD-10-CM

## 2023-04-25 LAB — HEMOGLOBIN A1C
Est. average glucose Bld gHb Est-mCnc: 111 mg/dL
Hgb A1c MFr Bld: 5.5 % (ref 4.8–5.6)

## 2023-04-25 LAB — COMPREHENSIVE METABOLIC PANEL
ALT: 23 [IU]/L (ref 0–32)
AST: 20 [IU]/L (ref 0–40)
Albumin: 4.5 g/dL (ref 3.9–4.9)
Alkaline Phosphatase: 74 [IU]/L (ref 44–121)
BUN/Creatinine Ratio: 15 (ref 9–23)
BUN: 11 mg/dL (ref 6–20)
Bilirubin Total: 0.2 mg/dL (ref 0.0–1.2)
CO2: 24 mmol/L (ref 20–29)
Calcium: 9.8 mg/dL (ref 8.7–10.2)
Chloride: 100 mmol/L (ref 96–106)
Creatinine, Ser: 0.75 mg/dL (ref 0.57–1.00)
Globulin, Total: 2.9 g/dL (ref 1.5–4.5)
Glucose: 81 mg/dL (ref 70–99)
Potassium: 4.7 mmol/L (ref 3.5–5.2)
Sodium: 141 mmol/L (ref 134–144)
Total Protein: 7.4 g/dL (ref 6.0–8.5)
eGFR: 108 mL/min/{1.73_m2} (ref >59)

## 2023-04-25 LAB — TSH: TSH: 1.76 u[IU]/mL (ref 0.450–4.500)

## 2023-04-25 MED ORDER — LEVOTHYROXINE SODIUM 75 MCG PO TABS: ORAL_TABLET | ORAL | 0 refills | Status: AC

## 2023-05-16 NOTE — Telephone Encounter (Signed)
 Lvm for pt to call back to schedule an in office appt

## 2023-05-16 NOTE — Telephone Encounter (Signed)
-----   Message from Danelle Berry sent at 05/16/2023 10:20 AM EST ----- Pt need appt to address labs/health etc Cannot interpret her lab results without seeing her recently, please invite her to come in for an in person appt

## 2024-01-02 LAB — PANORAMA PRENATAL TEST FULL PANEL:PANORAMA TEST PLUS 5 ADDITIONAL MICRODELETIONS: FETAL FRACTION: 2.5

## 2024-01-12 LAB — PANORAMA PRENATAL TEST FULL PANEL:PANORAMA TEST PLUS 5 ADDITIONAL MICRODELETIONS: FETAL FRACTION: 3.4

## 2024-03-23 ENCOUNTER — Inpatient Hospital Stay (HOSPITAL_COMMUNITY)

## 2024-03-23 ENCOUNTER — Inpatient Hospital Stay (HOSPITAL_COMMUNITY)
Admission: AD | Admit: 2024-03-23 | Discharge: 2024-03-23 | Disposition: A | Discharge status: Home / Self Care | Attending: Obstetrics and Gynecology | Admitting: Obstetrics and Gynecology

## 2024-03-23 ENCOUNTER — Encounter (HOSPITAL_COMMUNITY): Payer: Self-pay | Admitting: Obstetrics and Gynecology

## 2024-03-23 ENCOUNTER — Other Ambulatory Visit: Payer: Self-pay

## 2024-03-23 DIAGNOSIS — Z3689 Encounter for other specified antenatal screening: Secondary | ICD-10-CM | POA: Diagnosis not present

## 2024-03-23 DIAGNOSIS — O10912 Unspecified pre-existing hypertension complicating pregnancy, second trimester: Secondary | ICD-10-CM | POA: Insufficient documentation

## 2024-03-23 DIAGNOSIS — Z3A25 25 weeks gestation of pregnancy: Secondary | ICD-10-CM | POA: Diagnosis not present

## 2024-03-23 DIAGNOSIS — R1013 Epigastric pain: Secondary | ICD-10-CM

## 2024-03-23 DIAGNOSIS — O26899 Other specified pregnancy related conditions, unspecified trimester: Secondary | ICD-10-CM

## 2024-03-23 DIAGNOSIS — O26892 Other specified pregnancy related conditions, second trimester: Secondary | ICD-10-CM | POA: Diagnosis present

## 2024-03-23 DIAGNOSIS — Z8719 Personal history of other diseases of the digestive system: Secondary | ICD-10-CM | POA: Insufficient documentation

## 2024-03-23 DIAGNOSIS — O34219 Maternal care for unspecified type scar from previous cesarean delivery: Secondary | ICD-10-CM | POA: Insufficient documentation

## 2024-03-23 DIAGNOSIS — O99212 Obesity complicating pregnancy, second trimester: Secondary | ICD-10-CM | POA: Diagnosis not present

## 2024-03-23 DIAGNOSIS — R110 Nausea: Secondary | ICD-10-CM | POA: Diagnosis not present

## 2024-03-23 DIAGNOSIS — O24415 Gestational diabetes mellitus in pregnancy, controlled by oral hypoglycemic drugs: Secondary | ICD-10-CM | POA: Diagnosis not present

## 2024-03-23 MED ORDER — SIMETHICONE 80 MG PO TABS: 2.0000 | ORAL_TABLET | Freq: Three times a day (TID) | ORAL | 0 refills | Status: AC | PRN

## 2024-03-23 MED ORDER — LACTATED RINGERS IV BOLUS
1000.0000 mL | Freq: Once | INTRAVENOUS | Status: AC
  Administered 2024-03-23: 1000 mL via INTRAVENOUS

## 2024-03-23 MED ORDER — METOCLOPRAMIDE HCL 10 MG PO TABS: 10.0000 mg | ORAL_TABLET | Freq: Four times a day (QID) | ORAL | 0 refills | Status: AC | PRN

## 2024-03-23 MED ORDER — FAMOTIDINE 20 MG PO TABS: 20.0000 mg | ORAL_TABLET | Freq: Two times a day (BID) | ORAL | 0 refills | Status: AC | PRN

## 2024-03-23 MED ORDER — FAMOTIDINE IN NACL 20-0.9 MG/50ML-% IV SOLN
20.0000 mg | Freq: Once | INTRAVENOUS | Status: AC
  Administered 2024-03-23: 20 mg via INTRAVENOUS
  Filled 2024-03-23: qty 50

## 2024-03-23 MED ORDER — METOCLOPRAMIDE HCL 5 MG/ML IJ SOLN
10.0000 mg | Freq: Once | INTRAMUSCULAR | Status: AC
  Administered 2024-03-23: 10 mg via INTRAVENOUS
  Filled 2024-03-23: qty 2

## 2024-03-23 NOTE — Discharge Instructions (Signed)
Follow up with your  OB provider

## 2024-03-23 NOTE — MAU Provider Note (Signed)
 Chief Complaint:  Abdominal Pain and Back Pain   HPI    Mercedes Koch is a 33 y.o. G3P0010 at [redacted]w[redacted]d who presents to maternity admissions reporting patient is a transfer of care arrived via [facility] from [facility] emergency department from [facility] healthcare in [place] after being evaluated in the ED for chief complaint of upper abdominal pain across the epigastric area radiating to her mid back.  Patient was seen and evaluated by their OB/GYN who cleared her obstetrically.  The patient reports she has some nausea but denies any vomiting, constipation, diarrhea and reports passing flatus.  She offers no obstetrical complaints at this time.  No vaginal bleeding, leaking of fluid, contractions or lower abdominal cramping and feels baby moving.  The patient's history was significant for hypertension and is on medication.  Her blood pressures have been stable throughout her ED course and she denies any headaches, visual changes, shortness of breath, or chest pain  She does have a past medical history of a bowel obstruction in 2021 from a C-section scar no surgery was required.  Her ED course was consistent with lab work that was unremarkable and an ultrasound of the right upper quadrant for gallbladder.  The results were reviewed there was no acute process in the right upper abdomen although the pancreas and distal aorta were not clearly seen due to bowel gas.  She reports she was not given any medication during her ED evaluation except for her labetalol due to the fact that she has a history of chronic hypertension, GDM and hypothyroidism.  Patient was told she was being transferred to [facility] health facility for further imaging that may or may consist of a CT scan and/or an MRI.  I discussed this with the patient including side effects risks and benefits of advanced imaging in pregnancy and she is agreed to proceed with a CT scan.  (Informed consent was obtained and is present on the chart)  Pregnancy Course:  [facility]   Past Medical History:  Diagnosis Date   Anemia    Epilepsy (HCC)    History of anemia 06/10/2019   Hypertension    PCOS (polycystic ovarian syndrome)    Prediabetes 03/19/2020   SBO (small bowel obstruction) (HCC) 08/10/2019   OB History  Gravida Para Term Preterm AB Living  3    1   SAB IAB Ectopic Multiple Live Births  1        # Outcome Date GA Lbr Len/2nd Weight Sex Type Anes PTL Lv  3 Current           2 SAB 2015          1 Gravida            Past Surgical History:  Procedure Laterality Date   CESAREAN SECTION  2018   Family History  Problem Relation Age of Onset   Hypertension Mother    Hyperlipidemia Mother    Osteoporosis Mother    Fibromyalgia Mother    Breast cancer Mother    Heart attack Mother 72   Hypertension Father    Hyperlipidemia Father    Breast cancer Maternal Grandmother    Heart failure Maternal Grandmother    Hyperlipidemia Maternal Grandmother    Hypertension Maternal Grandmother    Pneumonia Maternal Grandfather    Dementia Maternal Grandfather    Alzheimer's disease Maternal Grandfather    Hypertension Maternal Grandfather    Breast cancer Paternal Grandmother    Stroke Paternal Grandmother  Kidney failure Paternal Grandfather    Prostate cancer Paternal Grandfather    Social History   Tobacco Use   Smoking status: Never   Smokeless tobacco: Never  Vaping Use   Vaping status: Never Used  Substance Use Topics   Alcohol use: Not Currently   Drug use: No   No Known Allergies Medications Prior to Admission  Medication Sig Dispense Refill Last Dose/Taking   aspirin EC 81 MG tablet Take 81 mg by mouth daily. Swallow whole.   03/22/2024   labetalol (NORMODYNE) 100 MG tablet Take 100 mg by mouth 2 (two) times daily.   03/22/2024   levothyroxine  (SYNTHROID ) 75 MCG tablet TAKE 1 TABLET BY MOUTH BEFORE BREAKFAST 90 tablet 0 03/22/2024   metFORMIN  (GLUCOPHAGE -XR) 500 MG 24 hr tablet Take 1 tablet (500 mg total) by mouth  daily. 90 tablet 0 03/22/2024   Prenatal Vit-Fe Fumarate-FA (PRENATAL MULTIVITAMIN) TABS tablet Take 1 tablet by mouth daily at 12 noon.   Past Week   SUMAtriptan  (IMITREX ) 25 MG tablet Take 25 to 50 mg PO at onset of headache, may repeat dose after 2 hours if HA persists.  Maximum daily dose 200 mg (Patient not taking: Reported on 03/23/2024) 20 tablet 1 Not Taking    I have reviewed patient's Past Medical Hx, Surgical Hx, Family Hx, Social Hx, medications and allergies.   ROS  Pertinent items noted in HPI and remainder of comprehensive ROS otherwise negative.   PHYSICAL EXAM   VSS  Constitutional: Well-developed, obese  female in no acute distress.  Cardiovascular: normal rate & rhythm, warm and well-perfused Respiratory: normal effort, no problems with respiration noted GI: Abd soft, no guarding, no rebound, no rigidity on palpation ( Limited exam due to increased body habitus) MS: Extremities nontender, no edema, normal ROM Neurologic: Alert and oriented x 4.  GU: no CVA tenderness      Fetal Tracing: NST reactive for GA Baseline: 145-150 Variability:moderate  Accelerations: absent  Decelerations: absent Toco: quite   Labs: No results found for this or any previous visit (from the past 24 hours).  Imaging:  CT ABDOMEN PELVIS WO CONTRAST Result Date: 03/23/2024 EXAM: CT ABDOMEN AND PELVIS WITHOUT CONTRAST 03/23/2024 05:23:35 AM TECHNIQUE: CT of the abdomen and pelvis was performed without the administration of intravenous contrast. Multiplanar reformatted images are provided for review. Automated exposure control, iterative reconstruction, and/or weight-based adjustment of the mA/kV was utilized to reduce the radiation dose to as low as reasonably achievable. COMPARISON: CT of the abdomen and pelvis dated 04/28/2022. CLINICAL HISTORY: Abdominal pain, acute, nonlocalized; upper abdominal pain in pregnancy. FINDINGS: LOWER CHEST: No acute abnormality. LIVER: The liver is  unremarkable. GALLBLADDER AND BILE DUCTS: Gallbladder is unremarkable. No biliary ductal dilatation. SPLEEN: No acute abnormality. PANCREAS: No acute abnormality. ADRENAL GLANDS: No acute abnormality. KIDNEYS, URETERS AND BLADDER: No stones in the kidneys or ureters. No hydronephrosis. No perinephric or periureteral stranding. Urinary bladder is unremarkable. GI AND BOWEL: Stomach demonstrates no acute abnormality. There is no bowel obstruction. PERITONEUM AND RETROPERITONEUM: No ascites. No free air. VASCULATURE: Aorta is normal in caliber. LYMPH NODES: No lymphadenopathy. REPRODUCTIVE ORGANS: Gravid uterus. No other acute abnormality. BONES AND SOFT TISSUES: No acute osseous abnormality. No focal soft tissue abnormality. IMPRESSION: 1. No acute findings in the abdomen or pelvis 2. Gravid uterus noted; correlate clinically. Avoid ionizing radiation in pregnancy when possible; consider ultrasound or MRI for future imaging needs as appropriate Electronically signed by: Evalene Coho MD 03/23/2024 05:30 AM EST RP Workstation:  [id number]    MDM & MAU COURSE  MDM:  HIGH-  CBC ( Records reviewed, no leucocytosis)  CMP ( [place] ) records reviewed Lipase ([place] ) records reviewed EKG reviewed from records received  IVF - LR Bolus Pepcid IV Reglan IVP  CT scan of the Abdomen and Pelvis ordered to R/O acute pathology (patient concerned about small bowel obstruction) Records reviewed show no evidence of acute cholecystitis they were unable to visualize pancreas due to bowel gas and morbid obesity (limited views with ultrasound) more advanced imaging will be needed to rule out acute pathology consistent with epigastric pain   Narrative & Impression  EXAM: CT ABDOMEN AND PELVIS WITHOUT CONTRAST 03/23/2024 05:23:35 AM   TECHNIQUE: CT of the abdomen and pelvis was performed without the administration of intravenous contrast. Multiplanar reformatted images are provided for review. Automated exposure control,  iterative reconstruction, and/or weight-based adjustment of the mA/kV was utilized to reduce the radiation dose to as low as reasonably achievable.   COMPARISON: CT of the abdomen and pelvis dated 04/28/2022.   CLINICAL HISTORY: Abdominal pain, acute, nonlocalized; upper abdominal pain in pregnancy.   FINDINGS:   LOWER CHEST: No acute abnormality.   LIVER: The liver is unremarkable.   GALLBLADDER AND BILE DUCTS: Gallbladder is unremarkable. No biliary ductal dilatation.   SPLEEN: No acute abnormality.   PANCREAS: No acute abnormality.   ADRENAL GLANDS: No acute abnormality.   KIDNEYS, URETERS AND BLADDER: No stones in the kidneys or ureters. No hydronephrosis. No perinephric or periureteral stranding. Urinary bladder is unremarkable.   GI AND BOWEL: Stomach demonstrates no acute abnormality. There is no bowel obstruction.   PERITONEUM AND RETROPERITONEUM: No ascites. No free air.   VASCULATURE: Aorta is normal in caliber.   LYMPH NODES: No lymphadenopathy.   REPRODUCTIVE ORGANS: Gravid uterus. No other acute abnormality.   BONES AND SOFT TISSUES: No acute osseous abnormality. No focal soft tissue abnormality.   IMPRESSION: 1. No acute findings in the abdomen or pelvis 2. Gravid uterus noted; correlate clinically. Avoid ionizing radiation in pregnancy when possible; consider ultrasound or MRI for future imaging needs as appropriate   Electronically signed by: Evalene Coho MD 03/23/2024 05:30 AM EST RP Workstation: [id number]     CT scan completed and reviewed at 0541 There are no acute findings in the abdomen or pelvis therefore the likelihood of acute pathology has been ruled out there is no evidence of small bowel obstruction.  This is likely a gastritis, gastroenteritis, or abdominal pain associated with pregnancy.   Patient reassessed and reports resolve with Pepcid/Reglan given. Therefore will  plan for discharge and follow-up closely with her  OB/GYN. Discharged home with Pepcid, Reglan, and simethicone as needed for her epigastric/abdominal  pain   Differential diagnosis with nausea/vomiting/abdominal pain includes but is not limited to: nausea due to pregnancy, hyperemesis gravidarum, viral infection, gastroenteritis, cholecystitis, pancreatitis, appendicitis , ACS, food poisoning, diabetic ketoacidosis, drug ingestion, gastroparesis, alcohol/opiate withdrawal   MAU Course: Orders Placed This Encounter  Procedures   CT ABDOMEN PELVIS WO CONTRAST   Discharge patient Discharge disposition: 01-Home or Self Care; Discharge patient date: 03/23/2024   Meds ordered this encounter  Medications   lactated ringers bolus 1,000 mL   famotidine (PEPCID) IVPB 20 mg premix   metoCLOPramide (REGLAN) injection 10 mg   famotidine (PEPCID) 20 MG tablet    Sig: Take 1 tablet (20 mg total) by mouth 2 (two) times daily as needed for heartburn or indigestion.    Dispense:  60 tablet    Refill:  0    Supervising Provider:   PRATT, TANYA S [[id number]]   metoCLOPramide (REGLAN) 10 MG tablet    Sig: Take 1 tablet (10 mg total) by mouth every 6 (six) hours as needed for nausea.    Dispense:  30 tablet    Refill:  0    Supervising Provider:   PRATT, TANYA S [[id number]]   Simethicone 80 MG TABS    Sig: Take 2 tablets (160 mg total) by mouth 3 (three) times daily as needed.    Dispense:  30 tablet    Refill:  0    Supervising Provider:   PRATT, TANYA S [[id number]]    I have reviewed the patient chart and performed the physical exam . I have ordered & interpreted the lab results and reviewed and interpreted the NST Medications ordered as stated below.  A/P as described below.  Counseling and education provided and patient agreeable  with plan as described below. Verbalized understanding.    ASSESSMENT   1. Epigastric abdominal pain affecting pregnancy   2. [redacted] weeks gestation of pregnancy   3. NST (non-stress test) reactive on fetal surveillance   4. Nausea      PLAN  Discharge home in stable condition with return precautions.   See AVS for full description of information given to the patient including both verbal and written. Patient verbalized understanding and agrees with the plan as described above.      Allergies as of 03/23/2024   No Known Allergies      Medication List     TAKE these medications    aspirin EC 81 MG tablet Take 81 mg by mouth daily. Swallow whole.   famotidine 20 MG tablet Commonly known as: Pepcid Take 1 tablet (20 mg total) by mouth 2 (two) times daily as needed for heartburn or indigestion.   labetalol 100 MG tablet Commonly known as: NORMODYNE Take 100 mg by mouth 2 (two) times daily.   levothyroxine  75 MCG tablet Commonly known as: SYNTHROID  TAKE 1 TABLET BY MOUTH BEFORE BREAKFAST   metFORMIN  500 MG 24 hr tablet Commonly known as: GLUCOPHAGE -XR Take 1 tablet (500 mg total) by mouth daily.   metoCLOPramide 10 MG tablet Commonly known as: REGLAN Take 1 tablet (10 mg total) by mouth every 6 (six) hours as needed for nausea.   prenatal multivitamin Tabs tablet Take 1 tablet by mouth daily at 12 noon.   Simethicone 80 MG Tabs Take 2 tablets (160 mg total) by mouth 3 (three) times daily as needed.   SUMAtriptan  25 MG tablet Commonly known as: Imitrex  Take 25 to 50 mg PO at onset of headache, may repeat dose after 2 hours if HA persists.  Maximum daily dose 200 mg        Mercedes Hottenstein, MSN, [facility], [facility]    This chart was dictated using voice recognition software, Dragon. Despite the best efforts of this provider to proofread and correct errors, errors may still occur which can change documentation meaning.

## 2024-03-23 NOTE — MAU Note (Signed)
 Mercedes Koch is a 33 y.o. at [redacted]w[redacted]d here in MAU reporting: upper abdominal & upper back pain, nausea, and loss of appetite ongoing x 3 days. Transferred via [facility] from [facility] for MRI d/t lack of radiologic access on the weekends at that facility.  Onset of complaint: 03/20/2024 Pain score: 6-7/10 Vitals:   03/23/24 0325 03/23/24 0414  BP:  135/64  Pulse:  (!) 101  Resp:  18  Temp:  98.6 F (37 C)  SpO2: 96%      FHT: 150bpm
